# Patient Record
Sex: Male | Born: 1975 | Race: Black or African American | Hispanic: No | Marital: Single | Smoking: Never smoker
Health system: Southern US, Community
[De-identification: ages and names within clinical notes are randomized; demographics above are authoritative.]

## PROBLEM LIST (undated history)

## (undated) DIAGNOSIS — F840 Autistic disorder: Secondary | ICD-10-CM

---

## 1997-05-17 ENCOUNTER — Encounter: Admission: RE | Admit: 1997-05-17 | Discharge: 1997-05-17 | Payer: Self-pay | Admitting: Family Medicine

## 1998-06-30 ENCOUNTER — Encounter: Admission: RE | Admit: 1998-06-30 | Discharge: 1998-06-30 | Payer: Self-pay | Admitting: Sports Medicine

## 2000-05-01 ENCOUNTER — Encounter: Admission: RE | Admit: 2000-05-01 | Discharge: 2000-05-01 | Payer: Self-pay | Admitting: Family Medicine

## 2001-07-01 ENCOUNTER — Encounter: Admission: RE | Admit: 2001-07-01 | Discharge: 2001-07-01 | Payer: Self-pay | Admitting: Family Medicine

## 2002-07-03 ENCOUNTER — Encounter: Admission: RE | Admit: 2002-07-03 | Discharge: 2002-07-03 | Payer: Self-pay | Admitting: Family Medicine

## 2003-06-30 ENCOUNTER — Encounter: Admission: RE | Admit: 2003-06-30 | Discharge: 2003-06-30 | Payer: Self-pay | Admitting: Family Medicine

## 2003-06-30 LAB — CONVERTED CEMR LAB
HDL: 39 mg/dL
HDL: 39 mg/dL
LDL Cholesterol: 108 mg/dL
LDL Cholesterol: 108 mg/dL

## 2004-07-05 ENCOUNTER — Ambulatory Visit: Payer: Self-pay | Admitting: Family Medicine

## 2005-07-12 ENCOUNTER — Ambulatory Visit: Payer: Self-pay | Admitting: Family Medicine

## 2006-02-28 DIAGNOSIS — F840 Autistic disorder: Secondary | ICD-10-CM | POA: Insufficient documentation

## 2006-07-16 ENCOUNTER — Ambulatory Visit: Payer: Self-pay | Admitting: Family Medicine

## 2006-07-16 DIAGNOSIS — R635 Abnormal weight gain: Secondary | ICD-10-CM

## 2007-07-11 ENCOUNTER — Ambulatory Visit: Payer: Self-pay | Admitting: Family Medicine

## 2008-03-26 ENCOUNTER — Telehealth: Payer: Self-pay | Admitting: *Deleted

## 2008-03-28 ENCOUNTER — Encounter: Payer: Self-pay | Admitting: Family Medicine

## 2008-07-23 ENCOUNTER — Ambulatory Visit: Payer: Self-pay | Admitting: Family Medicine

## 2008-07-27 ENCOUNTER — Encounter: Payer: Self-pay | Admitting: Family Medicine

## 2008-07-27 ENCOUNTER — Ambulatory Visit: Payer: Self-pay | Admitting: Family Medicine

## 2008-07-27 LAB — CONVERTED CEMR LAB
Cholesterol: 184 mg/dL (ref 0–200)
Triglycerides: 82 mg/dL (ref <150)

## 2008-10-11 ENCOUNTER — Ambulatory Visit: Payer: Self-pay | Admitting: Family Medicine

## 2009-07-28 ENCOUNTER — Ambulatory Visit: Payer: Self-pay | Admitting: Family Medicine

## 2009-07-28 DIAGNOSIS — E663 Overweight: Secondary | ICD-10-CM | POA: Insufficient documentation

## 2010-01-31 NOTE — Assessment & Plan Note (Signed)
Summary: cpe,tcb   Vital Signs:  Patient profile:   35 year old male Height:      67 inches Weight:      205.6 pounds BMI:     32.32 Temp:     99.3 degrees F oral Pulse rate:   91 / minute BP sitting:   134 / 83  Vitals Entered By: Golden Circle RN (July 28, 2009 1:45 PM)  Primary Care Provider:  Doree Albee MD   History of Present Illness: 30 YOM w/ PMhx/o mental retardation here for yearly physical:  Diet: Mom states that diet has improved since last visit. However, diet is still high in salt. Likes hot dogs and bacon, but still eats alot of fruits and vegetables per mom.  Obesity: [facility]. exercises 1 hour per day usually aerobic activity such as running and playing. Mom has also worked on [employer] as mentioned above.  Autism: Pt tests at moderate level of autism per mom-3rd grade level intellect. Mom denies any worsening in baseline function.  [facility] also teaching pt how to use money. Pt now able to pay fare independently on transit system, thousgh still using assistance.    Habits & Providers  Alcohol-Tobacco-Diet     Alcohol drinks/day: 0     Tobacco Status: never  Allergies: No Known Drug Allergies  Physical Exam  General:  alert, cooperative to examination, and overweight-appearing.   Head:  normocephalic and atraumatic.   Eyes:  eyes closed, pupils intermittently visible Ears:  TMs clear bilaterally, + obstructing cerumen in L ear Nose:  no external deformity, no nasal discharge, and no mucosal edema.   Mouth:  fair dentition pharynx pink and moist, no exudates, and no posterior lymphoid hypertrophy.   Neck:  supple and full ROM.   Chest Wall:  non tender, Lungs:  CTAB,no wheezes, rales,rhoncii Heart:  RRR, no rubs, gallops, murmurs Abdomen:  obese, NT/+bowel sounds Msk:  normal ROM, no joint tenderness, and no joint swelling.   Extremities:  no edema, 2+ peripheral pulses Neurologic:  repsonsive to  commands though very little verbal interaction.  CN II-XII grossly intact Skin:  turgor normal.   Psych:  poor eye contact.     Impression & Recommendations:  Problem # 1:  OVERWEIGHT (ICD-278.02) Pt w/ noted 12 lb weight loss since last clinical visit. BMI @ 32 (33 last year) Mom/pt encouraged to continue exercise regimen at [facility] program as well as continue to improving diet regimen-with increasing frutis and vegetables and decreasing sodium.  Will fingerstick CBG to assess for possible hyperglycemia as mom reports extensive family hx/o DM and is concerned about pt having DM.   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 Mom/pt encouraged to continue w/ [facility] program as this provided great overall benefit to patient.   Other Orders: Glucose Cap-[facility] (56213)  Patient Instructions: 1)  It was good meeting you today 2)  Brian Livingston is overall in very good health 3)  We will check a random sugar today  4)  Come back in 1-2 weeks for a random blood pressure check 5)  He has also lost alot of weigh-CONGRATULATIONS 6)  Otherwise return to clinic in 1 year  7)  If you have any questions, please feel free to call the [facility]  8)  God Bless, 9)  Doree Albee MD  Appended Document: Office Visit Billing    Clinical Lists Changes  Orders: Added new Test order  of [facility]- Est Level  3 ([id number]) - Signed      Appended Document: cpe,tcb    Clinical Lists Changes  Orders: Added new Test order of [facility]- Est Level  3 (29562) - Signed Added new Service order of First annual wellness visit with prevention plan  (Z3086) - Signed Added new Service order of First annual wellness visit with prevention plan  (V7846) - Signed

## 2010-08-18 ENCOUNTER — Ambulatory Visit (INDEPENDENT_AMBULATORY_CARE_PROVIDER_SITE_OTHER): Payer: Medicare Other | Admitting: Family Medicine

## 2010-08-18 ENCOUNTER — Encounter: Payer: Self-pay | Admitting: Family Medicine

## 2010-08-18 DIAGNOSIS — F79 Unspecified intellectual disabilities: Secondary | ICD-10-CM

## 2010-08-18 DIAGNOSIS — E663 Overweight: Secondary | ICD-10-CM

## 2010-08-18 NOTE — Patient Instructions (Signed)
Brian Livingston is otherwise doing well Congratulations on his weight loss Come back to see me in 1 year.  Call with any questions,  God Bless,  Doree Albee MD

## 2010-08-18 NOTE — Assessment & Plan Note (Signed)
Currently in day program and is relatively high functioning.

## 2010-08-18 NOTE — Progress Notes (Signed)
  Subjective:    Patient ID: Brian Livingston, male    DOB: 03/20/1975, 35 y.o.   MRN: [id number]  HPI 34 YOM w/ hx/o mental retardation here for annual physical. No acute issues or complaints per mom.  Pt currently in day care program Monday-Friday; has day activities that include shopping, museum trips, and day classes a [employer] Pt also eating low carbohydrate diet and exercising 3-4 hours per week.  Pt with moderate retardation; currently testing at 3rd grade level.  Review of Systems See HPI     Objective:   Physical Exam  Constitutional: He appears well-developed and well-nourished.  HENT:  Head: Normocephalic and atraumatic.  Right Ear: External ear normal.  Left Ear: External ear normal.  Eyes: Pupils are equal, round, and reactive to light.  Neck: Normal range of motion. Neck supple.  Cardiovascular: Normal rate and regular rhythm.   Pulmonary/Chest: Effort normal and breath sounds normal.  Abdominal: Soft. Bowel sounds are normal.  Neurological:       Alert responsive to commands, poor eye contact   Skin: Skin is warm and dry.  Psychiatric: He has a normal mood and affect. His behavior is normal.          Assessment & Plan:

## 2010-08-18 NOTE — Assessment & Plan Note (Signed)
15 lb weight loss since last clinical appointment. Encouraged current regimen.

## 2011-08-20 ENCOUNTER — Ambulatory Visit (INDEPENDENT_AMBULATORY_CARE_PROVIDER_SITE_OTHER): Payer: Medicare Other | Admitting: Emergency Medicine

## 2011-08-20 ENCOUNTER — Encounter: Payer: Self-pay | Admitting: Emergency Medicine

## 2011-08-20 VITALS — BP 132/83 | HR 84 | Ht 68.0 in | Wt 215.0 lb

## 2011-08-20 DIAGNOSIS — F79 Unspecified intellectual disabilities: Secondary | ICD-10-CM

## 2011-08-20 DIAGNOSIS — E663 Overweight: Secondary | ICD-10-CM

## 2011-08-20 NOTE — Assessment & Plan Note (Signed)
Plan to increase activity and reduce sweets.  Offered nutrition referral if needed.

## 2011-08-20 NOTE — Assessment & Plan Note (Signed)
Stable.  Attends [facility] 4x per week.

## 2011-08-20 NOTE — Progress Notes (Signed)
  Subjective:    Patient ID: Brian Livingston, male    DOB: 1975-02-12, 36 y.o.   MRN: [id number]  HPI Sevin Langenbach is here for annual exam.  History obtained from mother.  Patient has intellectual disability at 3rd grade level.  Attends [facility] 4 days a week.  Mom reports no complaints or concerns.  She states he has gained some weight since last year which she attributes to her husband living with them and providing sugary snacks.  Plan to request at least 30 minutes of basketball at [employer] and work on cutting back snacks (substituting pop-ices).  She notes that he has been snacking more at night recently.  Does well with vegetables, low salt diet, but likes sugar.  I have reviewed and updated the following as appropriate: allergies, current medications and problem list SHx: never smoker  Review of Systems No fever, chest pain, shortness of breath, abdominal pain, constipation    Objective:   Physical Exam BP 132/83  Pulse 84  Ht 5\' 8"  (1.727 m)  Wt 215 lb (97.523 kg)  BMI 32.69 kg/m2 Gen: alert, cooperative, minimally verbal HEENT: AT/[place], sclera white, MMM, no pharyngeal erythema or exudate, TMs normal bilaterally Neck: no LAD, supple, normal thyroid CV: RRR, no murmurs Pulm: CTAB, no wheezes or rales Abd: +BS, soft, NTND Ext: no edema, WWP      Assessment & Plan:

## 2011-08-20 NOTE — Patient Instructions (Addendum)
It was nice to meet you!  Everything is going very well - you have an excellent plan for Adell's weight management.  If you need additional ideas, I can refer you to our nutritionist.  Follow up in 1 year for annual exam.  We will likely draw blood at that appointment for cholesterol and blood sugar.

## 2012-07-20 ENCOUNTER — Emergency Department (INDEPENDENT_AMBULATORY_CARE_PROVIDER_SITE_OTHER)
Admission: EM | Admit: 2012-07-20 | Discharge: 2012-07-20 | Disposition: A | Discharge status: Home / Self Care | Payer: Medicare Other | Source: Home / Self Care

## 2012-07-20 ENCOUNTER — Emergency Department (INDEPENDENT_AMBULATORY_CARE_PROVIDER_SITE_OTHER): Payer: Medicare Other

## 2012-07-20 ENCOUNTER — Encounter (HOSPITAL_COMMUNITY): Payer: Self-pay | Admitting: *Deleted

## 2012-07-20 DIAGNOSIS — J329 Chronic sinusitis, unspecified: Secondary | ICD-10-CM

## 2012-07-20 HISTORY — DX: Autistic disorder: F84.0

## 2012-07-20 MED ORDER — GUAIFENESIN 100 MG/5ML PO LIQD: 200.0000 mg | ORAL | Status: DC | PRN

## 2012-07-20 MED ORDER — FLUTICASONE PROPIONATE 50 MCG/ACT NA SUSP: 2.0000 | Freq: Every day | NASAL | Status: DC

## 2012-07-20 MED ORDER — AMOXICILLIN-POT CLAVULANATE 875-125 MG PO TABS: 1.0000 | ORAL_TABLET | Freq: Two times a day (BID) | ORAL | Status: DC

## 2012-07-20 MED ORDER — FEXOFENADINE-PSEUDOEPHED ER 60-120 MG PO TB12: 1.0000 | ORAL_TABLET | Freq: Two times a day (BID) | ORAL | Status: DC

## 2012-07-20 NOTE — ED Provider Notes (Signed)
History    CSN: [id number] Arrival date & time 07/20/12  1150  First MD Initiated Contact with Patient 07/20/12 1241     Chief Complaint  Patient presents with  . Cough   (Consider location/radiation/quality/duration/timing/severity/associated sxs/prior Treatment) HPI  37 yo bm comes in with family member for cough, congestion x one week.  Patient is autistic. Mother states he has been getting worse the last couple of days.  Denies fever, chills, nausea, vomiting, dyspnea.  Has some sinus pressure.   Past Medical History  Diagnosis Date  . Autism    History reviewed. No pertinent past surgical history. No family history on file. History  Substance Use Topics  . Smoking status: Never Smoker   . Smokeless tobacco: Not on file  . Alcohol Use: No    Review of Systems  Constitutional: Negative for fever, chills and appetite change.  HENT: Positive for congestion, rhinorrhea and postnasal drip. Negative for ear pain, neck stiffness and ear discharge.   Eyes: Negative.   Respiratory: Positive for cough. Negative for choking, chest tightness, shortness of breath, wheezing and stridor.   Gastrointestinal: Negative.   Endocrine: Negative.   Genitourinary: Negative.   Allergic/Immunologic: Negative.   Hematological: Negative.   Psychiatric/Behavioral: Negative for agitation.    Allergies  Review of patient's allergies indicates no known allergies.  Home Medications   Current Outpatient Rx  Name  Route  Sig  Dispense  Refill  . amoxicillin-clavulanate (AUGMENTIN) 875-125 MG per tablet   Oral   Take 1 tablet by mouth every 12 (twelve) hours.   20 tablet   0   . fexofenadine-pseudoephedrine (ALLEGRA-D) 60-120 MG per tablet   Oral   Take 1 tablet by mouth every 12 (twelve) hours.   30 tablet   0   . fluticasone (FLONASE) 50 MCG/ACT nasal spray   Nasal   Place 2 sprays into the nose daily.   16 g   2   . guaiFENesin (ROBITUSSIN) 100 MG/5ML liquid   Oral   Take 10  mLs (200 mg total) by mouth every 4 (four) hours as needed for cough.   60 mL   1    BP 120/64  Pulse 88  Temp(Src) 98.1 F (36.7 C) (Oral)  Resp 16  SpO2 100% Physical Exam  Constitutional: He appears well-developed and well-nourished.  HENT:  Head: Normocephalic and atraumatic.  Nose: Nose normal.  Mouth/Throat: Oropharynx is clear and moist.  Eyes: EOM are normal. Pupils are equal, round, and reactive to light.  Neck: Normal range of motion.  Cardiovascular: Normal rate and regular rhythm.   Pulmonary/Chest: Effort normal and breath sounds normal. No respiratory distress. He has no wheezes. He has no rales.  Abdominal: Soft.  Musculoskeletal: Normal range of motion.  Lymphadenopathy:    He has no cervical adenopathy.  Skin: Skin is warm and dry.    ED Course  Procedures (including critical care time) Labs Reviewed  CULTURE, GROUP A STREP  POCT RAPID STREP A ([facility]RG CARE ONLY)   Dg Chest 2 View  07/20/2012   *RADIOLOGY REPORT*  Clinical Data: Cough  CHEST - 2 VIEW  Comparison: None.  Findings: Lungs are clear. No pleural effusion or pneumothorax.  Cardiomediastinal silhouette is within normal limits.  Visualized osseous structures are within normal limits.  IMPRESSION: No evidence of acute cardiopulmonary disease.   Original Report Authenticated By: Charline Bills, M.D.   1. Sinusitis     MDM  Advised mother will treat for sinusitis.  Instructions given.  F/u in 3-5 days if not better.  Return here or go to ED is worsens.  She voices understanding.  All questions answered.   Meds ordered this encounter  Medications  . amoxicillin-clavulanate (AUGMENTIN) 875-125 MG per tablet    Sig: Take 1 tablet by mouth every 12 (twelve) hours.    Dispense:  20 tablet    Refill:  0  . fexofenadine-pseudoephedrine (ALLEGRA-D) 60-120 MG per tablet    Sig: Take 1 tablet by mouth every 12 (twelve) hours.    Dispense:  30 tablet    Refill:  0  . fluticasone (FLONASE) 50 MCG/ACT  nasal spray    Sig: Place 2 sprays into the nose daily.    Dispense:  16 g    Refill:  2  . guaiFENesin (ROBITUSSIN) 100 MG/5ML liquid    Sig: Take 10 mLs (200 mg total) by mouth every 4 (four) hours as needed for cough.    Dispense:  60 mL    Refill:  1    Zonia Kief, PA-C 07/22/12 1419

## 2012-07-20 NOTE — ED Notes (Signed)
Pt   Has   Symptoms  Of  A  Non  Productive   Cough  With  Symptoms  Of  Nasal  Congestion as  Well  That  Started  About  1  Week  Ago      With a  Stuffy  Nose    He  Has  A  histiry  Of  Autism   And   His  Mother  Is  With  Him

## 2012-07-22 LAB — CULTURE, GROUP A STREP

## 2012-07-23 NOTE — ED Provider Notes (Signed)
Medical screening examination/treatment/procedure(s) were performed by a resident physician or non-physician practitioner and as the supervising physician I was immediately available for consultation/collaboration.  Clementeen Graham, MD   Rodolph Bong, MD 07/23/12 1045

## 2012-08-22 ENCOUNTER — Encounter: Payer: Self-pay | Admitting: Emergency Medicine

## 2012-08-22 ENCOUNTER — Ambulatory Visit (INDEPENDENT_AMBULATORY_CARE_PROVIDER_SITE_OTHER): Payer: PRIVATE HEALTH INSURANCE | Admitting: Emergency Medicine

## 2012-08-22 VITALS — BP 119/75 | HR 90 | Temp 97.6°F | Ht 68.0 in | Wt 219.0 lb

## 2012-08-22 DIAGNOSIS — E663 Overweight: Secondary | ICD-10-CM

## 2012-08-22 DIAGNOSIS — Z0000 Encounter for general adult medical examination without abnormal findings: Secondary | ICD-10-CM

## 2012-08-22 LAB — LIPID PANEL
Cholesterol: 181 mg/dL (ref 0–200)
HDL: 37 mg/dL — ABNORMAL LOW (ref >39)
Total CHOL/HDL Ratio: 4.9 Ratio
Triglycerides: 115 mg/dL (ref <150)

## 2012-08-22 LAB — COMPREHENSIVE METABOLIC PANEL
Alkaline Phosphatase: 49 U/L (ref 39–117)
BUN: 16 mg/dL (ref 6–23)
CO2: 28 mEq/L (ref 19–32)
Glucose, Bld: 76 mg/dL (ref 70–99)
Sodium: 138 mEq/L (ref 135–145)
Total Bilirubin: 0.6 mg/dL (ref 0.3–1.2)
Total Protein: 7.8 g/dL (ref 6.0–8.3)

## 2012-08-22 NOTE — Progress Notes (Signed)
  Subjective:    Patient ID: Brian Livingston, male    DOB: [birth year], 37 y.o.   MRN: [id number]  HPI Brian Livingston is here for his annual exam.  He is brought in by him mother who provides the history.  I have reviewed and updated the following as appropriate: allergies, current medications, past family history, past medical history, past social history, past surgical history and problem list PMHx: none  FHx: diabetes, kidney disease SHx: never smoker  - He was treated for a sinus infection by Urgent Care last month and mom reports complete resolution of his cough and congestion. - Mom continues to be concerned about his diet, but tries to give him healthy snacks.  His day care, [facility], has exercise equipment that he uses for 30 minutes 4x/week; he also walks with his mother.   Review of Systems Patient Information Form: Screening and ROS Review of Symptoms  General:  Negative for nexplained weight loss, fever Skin: Negative for new or changing mole, sore that won't heal HEENT: Negative for trouble hearing, trouble seeing, ringing in ears, mouth sores, hoarseness, change in voice, dysphagia. CV:  Negative for chest pain, dyspnea, edema, palpitations Resp: Negative for cough, dyspnea, hemoptysis GI: Negative for nausea, vomiting, diarrhea, constipation, abdominal pain, melena, hematochezia. GU: Negative for dysuria, incontinence, urinary hesitance, hematuria, vaginal or penile discharge, polyuria, sexual difficulty, lumps in testicle or breasts MSK: Negative for muscle cramps or aches, joint pain or swelling      Objective:   Physical Exam BP 119/75  Pulse 90  Temp(Src) 97.6 F (36.4 C) (Oral)  Ht 5\' 8"  (1.727 m)  Wt 219 lb (99.338 kg)  BMI 33.31 kg/m2 Gen: alert, cooperative, NAD, sits quietly on exam table with eyes closed HEENT: AT/, sclera white, MMM, TMs normal bilaterally Neck: supple, no LAD CV: RRR, no murmurs Pulm: CTAB, no wheezes or rales Abd: +BS,  soft, NTND Ext: no edema, 2+ DP pulses bilaterally     Assessment & Plan:

## 2012-08-22 NOTE — Assessment & Plan Note (Signed)
Doing well with no concerns.   Checking screening labs. F/u in 1 year or sooner as needed.

## 2012-08-22 NOTE — Assessment & Plan Note (Signed)
Weight up 4lbs from last year. Mom continues to work on diet and activity level. No additional recommendations. Will check lipids and CMP.

## 2012-08-22 NOTE — Patient Instructions (Addendum)
It was nice to see you!  Everything looks good. Keep working on [employer] and activity.  We are checking some blood work today.  I will call you if anything is wrong, otherwise you will get a letter with the results in the mail in 1-2 weeks.  Follow up in 1 year or sooner as needed.

## 2012-08-26 ENCOUNTER — Encounter: Payer: Self-pay | Admitting: Emergency Medicine

## 2012-10-31 ENCOUNTER — Telehealth: Payer: Self-pay | Admitting: Emergency Medicine

## 2012-10-31 ENCOUNTER — Ambulatory Visit (INDEPENDENT_AMBULATORY_CARE_PROVIDER_SITE_OTHER): Payer: PRIVATE HEALTH INSURANCE | Admitting: *Deleted

## 2012-10-31 DIAGNOSIS — Z23 Encounter for immunization: Secondary | ICD-10-CM

## 2012-10-31 MED ORDER — FLUTICASONE PROPIONATE 50 MCG/ACT NA SUSP: 2.0000 | Freq: Every day | NASAL | Status: DC

## 2012-10-31 NOTE — Telephone Encounter (Signed)
Pt needing flactonaise nasal spray sent to [facility] off [address].

## 2012-10-31 NOTE — Telephone Encounter (Signed)
Prescription sent to [facility].

## 2013-08-26 ENCOUNTER — Encounter: Payer: Self-pay | Admitting: Family Medicine

## 2013-08-26 ENCOUNTER — Ambulatory Visit (INDEPENDENT_AMBULATORY_CARE_PROVIDER_SITE_OTHER): Payer: PRIVATE HEALTH INSURANCE | Admitting: Family Medicine

## 2013-08-26 VITALS — BP 128/83 | HR 84 | Temp 98.0°F | Wt 233.0 lb

## 2013-08-26 DIAGNOSIS — F79 Unspecified intellectual disabilities: Secondary | ICD-10-CM

## 2013-08-26 DIAGNOSIS — J309 Allergic rhinitis, unspecified: Secondary | ICD-10-CM

## 2013-08-26 DIAGNOSIS — Z0000 Encounter for general adult medical examination without abnormal findings: Secondary | ICD-10-CM

## 2013-08-26 DIAGNOSIS — E669 Obesity, unspecified: Secondary | ICD-10-CM

## 2013-08-26 LAB — CBC WITH DIFFERENTIAL/PLATELET
Basophils Absolute: 0 10*3/uL (ref 0.0–0.1)
Basophils Relative: 0 % (ref 0–1)
EOS PCT: 2 % (ref 0–5)
Eosinophils Absolute: 0.2 10*3/uL (ref 0.0–0.7)
HEMATOCRIT: 43.9 % (ref 39.0–52.0)
HEMOGLOBIN: 15.5 g/dL (ref 13.0–17.0)
LYMPHS ABS: 4.1 10*3/uL — AB (ref 0.7–4.0)
LYMPHS PCT: 54 % — AB (ref 12–46)
MCH: 33.2 pg (ref 26.0–34.0)
MCHC: 35.3 g/dL (ref 30.0–36.0)
MCV: 94 fL (ref 78.0–100.0)
MONO ABS: 0.7 10*3/uL (ref 0.1–1.0)
MONOS PCT: 9 % (ref 3–12)
Neutro Abs: 2.7 10*3/uL (ref 1.7–7.7)
Neutrophils Relative %: 35 % — ABNORMAL LOW (ref 43–77)
Platelets: 159 10*3/uL (ref 150–400)
RBC: 4.67 MIL/uL (ref 4.22–5.81)
RDW: 13.5 % (ref 11.5–15.5)
WBC: 7.6 10*3/uL (ref 4.0–10.5)

## 2013-08-26 MED ORDER — CETIRIZINE HCL 10 MG PO TABS: 10.0000 mg | ORAL_TABLET | Freq: Every day | ORAL | Status: DC

## 2013-08-26 NOTE — Assessment & Plan Note (Signed)
Mother feels there is some improvement on Flonase spray, continue Swollen turbinates bilaterally Had Zyrtec due to continued congestion. Followup when necessary

## 2013-08-26 NOTE — Progress Notes (Signed)
Patient ID: Brian Livingston, male   DOB: [birth year], 38 y.o.   MRN: [id number]  Kevin Fenton, MD Phone: [phone]  Subjective:  Chief complaint-noted  Pt Here for annual exam  Seasonal allergies Mother feels that starting in the fall every year he gets worsening congestion at night. He has been using fluticasone nasal spray 2 sprays each nostril every night for several months and she does feel like he is improved on this. However he does continue to have congestion problems. She's wondering if she should switch him to play nasal saline.  Weight He has had worsening dietary indiscretions lately as his father moved back in the house and has cookies and snacks around. [facility] is [facility] at [facility] several days a week. Mother tries to control his diet as well as possible.  ROS-  No fever, chills, sweats Positive congestion No dyspnea Positive slight weight gain over the last year No diarrhea or vomiting No swelling No problems walking.  Past Medical History Patient Active Problem List   Diagnosis Date Noted  . Annual physical exam 08/22/2012  . Overweight 07/28/2009  . MENTAL RETARDATION 02/28/2006    Medications- reviewed and updated Current Outpatient Prescriptions  Medication Sig Dispense Refill  . fluticasone (FLONASE) 50 MCG/ACT nasal spray Place 2 sprays into the nose daily.  16 g  11   No current facility-administered medications for this visit.    Objective: BP 128/83  Pulse 84  Temp(Src) 98 F (36.7 C) (Oral)  Wt 233 lb (105.688 kg) Gen: NAD, alert, cooperative with exam, looking down with his eyes closed throughout the exam HEENT: NCAT, EOMI, PERRL CV: RRR, good S1/S2, no murmur Resp: CTABL, no wheezes, non-labored Abd: SNTND, BS present, no guarding or organomegaly Ext: No edema, warm Neuro: Alert, only uses a few words that are difficult to understand, no gross deficits.   Assessment/Plan:  Annual physical exam Doing well with no concerns,  gaining a little weight due to dietary indiscretion Checking screening labs Followup one year. Sooner if needed  MENTAL RETARDATION Stable still attends [facility] 4 times per week.  Allergic rhinitis Mother feels there is some improvement on Flonase spray, continue Swollen turbinates bilaterally Had Zyrtec due to continued congestion. Followup when necessary    Orders Placed This Encounter  Procedures  . Comprehensive metabolic panel  . CBC with Differential  . Lipid Panel    Meds ordered this encounter  Medications  . cetirizine (ZYRTEC) 10 MG tablet    Sig: Take 1 tablet (10 mg total) by mouth daily.    Dispense:  30 tablet    Refill:  11

## 2013-08-26 NOTE — Patient Instructions (Signed)
Start zyrtec (off brand ok) 1 pill once a day Continue the nasal spray 2 sprays per nostril once daily.   Continue trying to watch his diet and keeping him active.

## 2013-08-26 NOTE — Assessment & Plan Note (Signed)
Stable still attends [facility] times per week.

## 2013-08-26 NOTE — Assessment & Plan Note (Signed)
Doing well with no concerns, gaining a little weight due to dietary indiscretion Checking screening labs Followup one year. Sooner if needed

## 2013-08-27 ENCOUNTER — Encounter: Payer: Self-pay | Admitting: Family Medicine

## 2013-08-27 LAB — COMPREHENSIVE METABOLIC PANEL
ALT: 40 U/L (ref 0–53)
AST: 38 U/L — ABNORMAL HIGH (ref 0–37)
Albumin: 4.5 g/dL (ref 3.5–5.2)
Alkaline Phosphatase: 50 U/L (ref 39–117)
BILIRUBIN TOTAL: 0.7 mg/dL (ref 0.2–1.2)
BUN: 15 mg/dL (ref 6–23)
CO2: 27 meq/L (ref 19–32)
CREATININE: 0.96 mg/dL (ref 0.50–1.35)
Calcium: 9.4 mg/dL (ref 8.4–10.5)
Chloride: 102 mEq/L (ref 96–112)
Glucose, Bld: 81 mg/dL (ref 70–99)
Potassium: 4.1 mEq/L (ref 3.5–5.3)
Sodium: 137 mEq/L (ref 135–145)
Total Protein: 8.2 g/dL (ref 6.0–8.3)

## 2013-08-27 LAB — LIPID PANEL
CHOL/HDL RATIO: 4.6 ratio
Cholesterol: 194 mg/dL (ref 0–200)
HDL: 42 mg/dL (ref >39)
LDL CALC: 129 mg/dL — AB (ref 0–99)
Triglycerides: 116 mg/dL (ref <150)
VLDL: 23 mg/dL (ref 0–40)

## 2013-09-09 DIAGNOSIS — E669 Obesity, unspecified: Secondary | ICD-10-CM | POA: Insufficient documentation

## 2013-10-23 ENCOUNTER — Ambulatory Visit (INDEPENDENT_AMBULATORY_CARE_PROVIDER_SITE_OTHER): Payer: PRIVATE HEALTH INSURANCE | Admitting: *Deleted

## 2013-10-23 DIAGNOSIS — Z23 Encounter for immunization: Secondary | ICD-10-CM

## 2013-11-04 ENCOUNTER — Other Ambulatory Visit: Payer: Self-pay | Admitting: *Deleted

## 2013-11-04 MED ORDER — FLUTICASONE PROPIONATE 50 MCG/ACT NA SUSP: 2.0000 | Freq: Every day | NASAL | Status: DC

## 2014-06-23 IMAGING — CR DG CHEST 2V
2 series · 2 of 2 positions shown · non-contrast
Comparison: None.

CLINICAL DATA: Cough

CHEST - 2 VIEW

[view not recorded (1 of 2)]
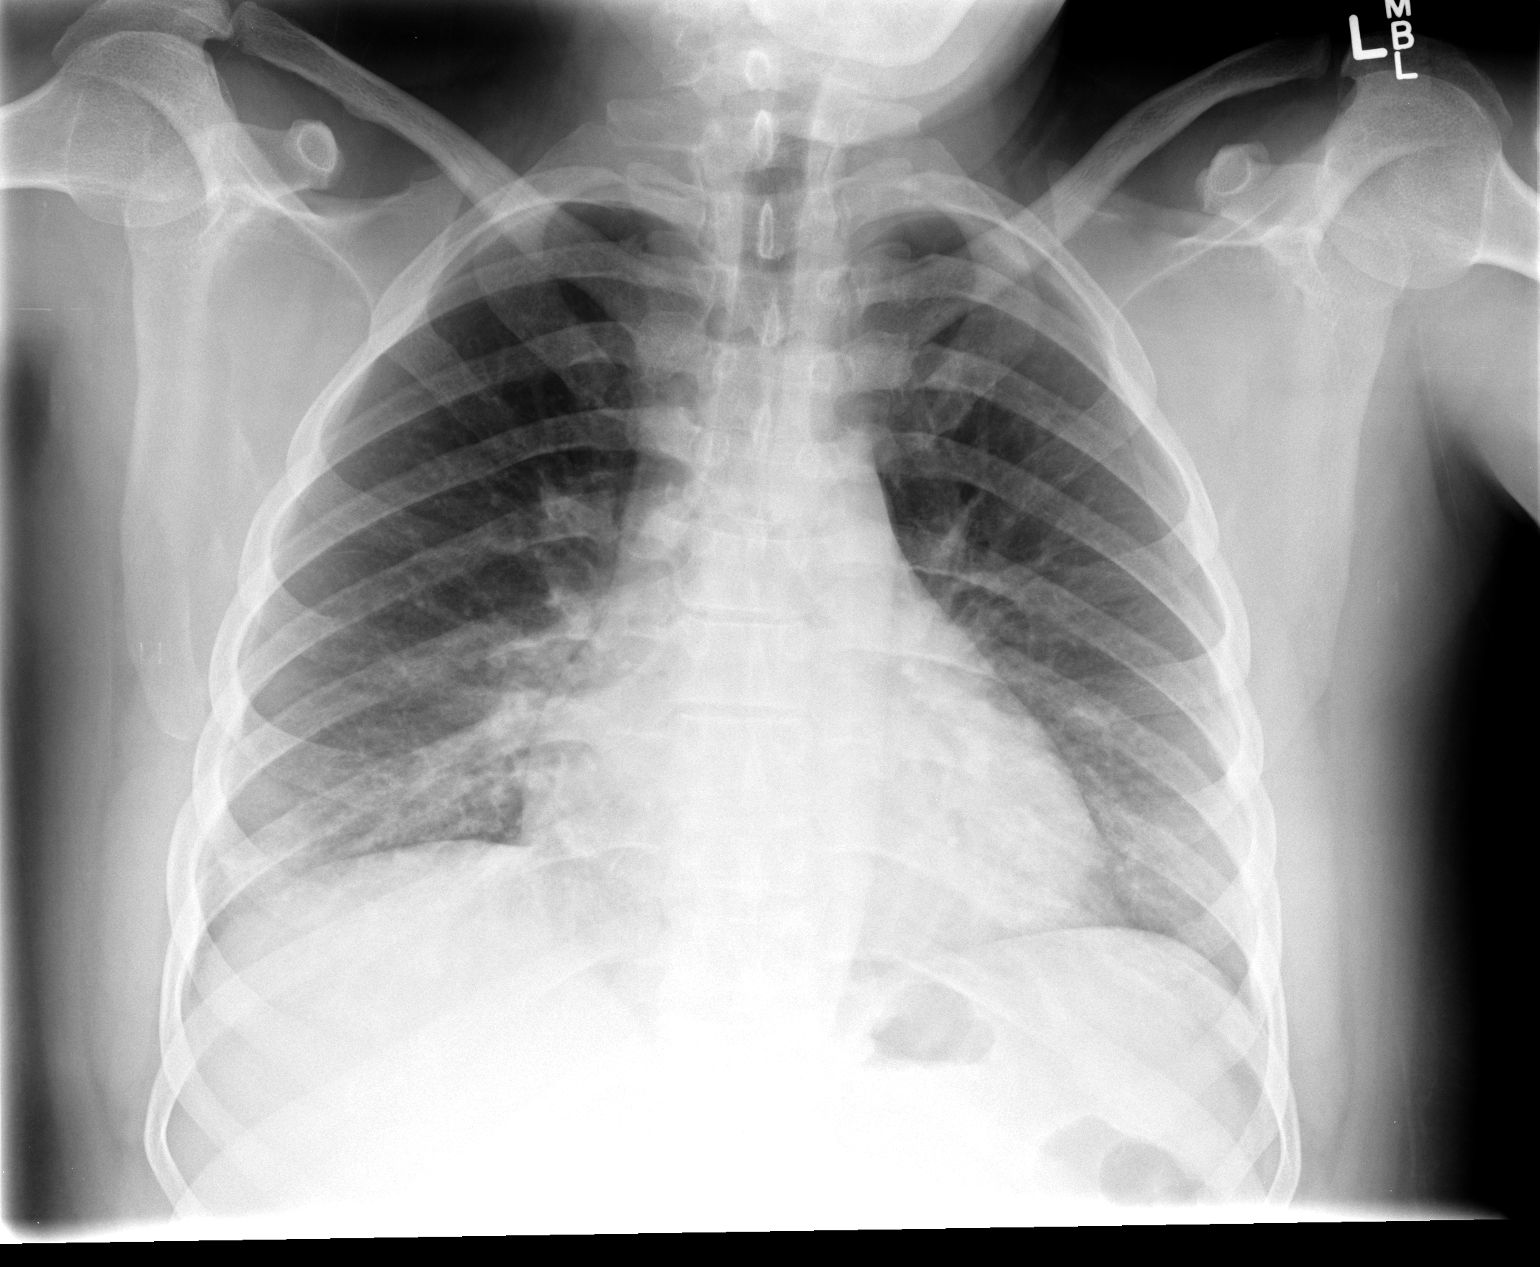

[view not recorded (2 of 2)]
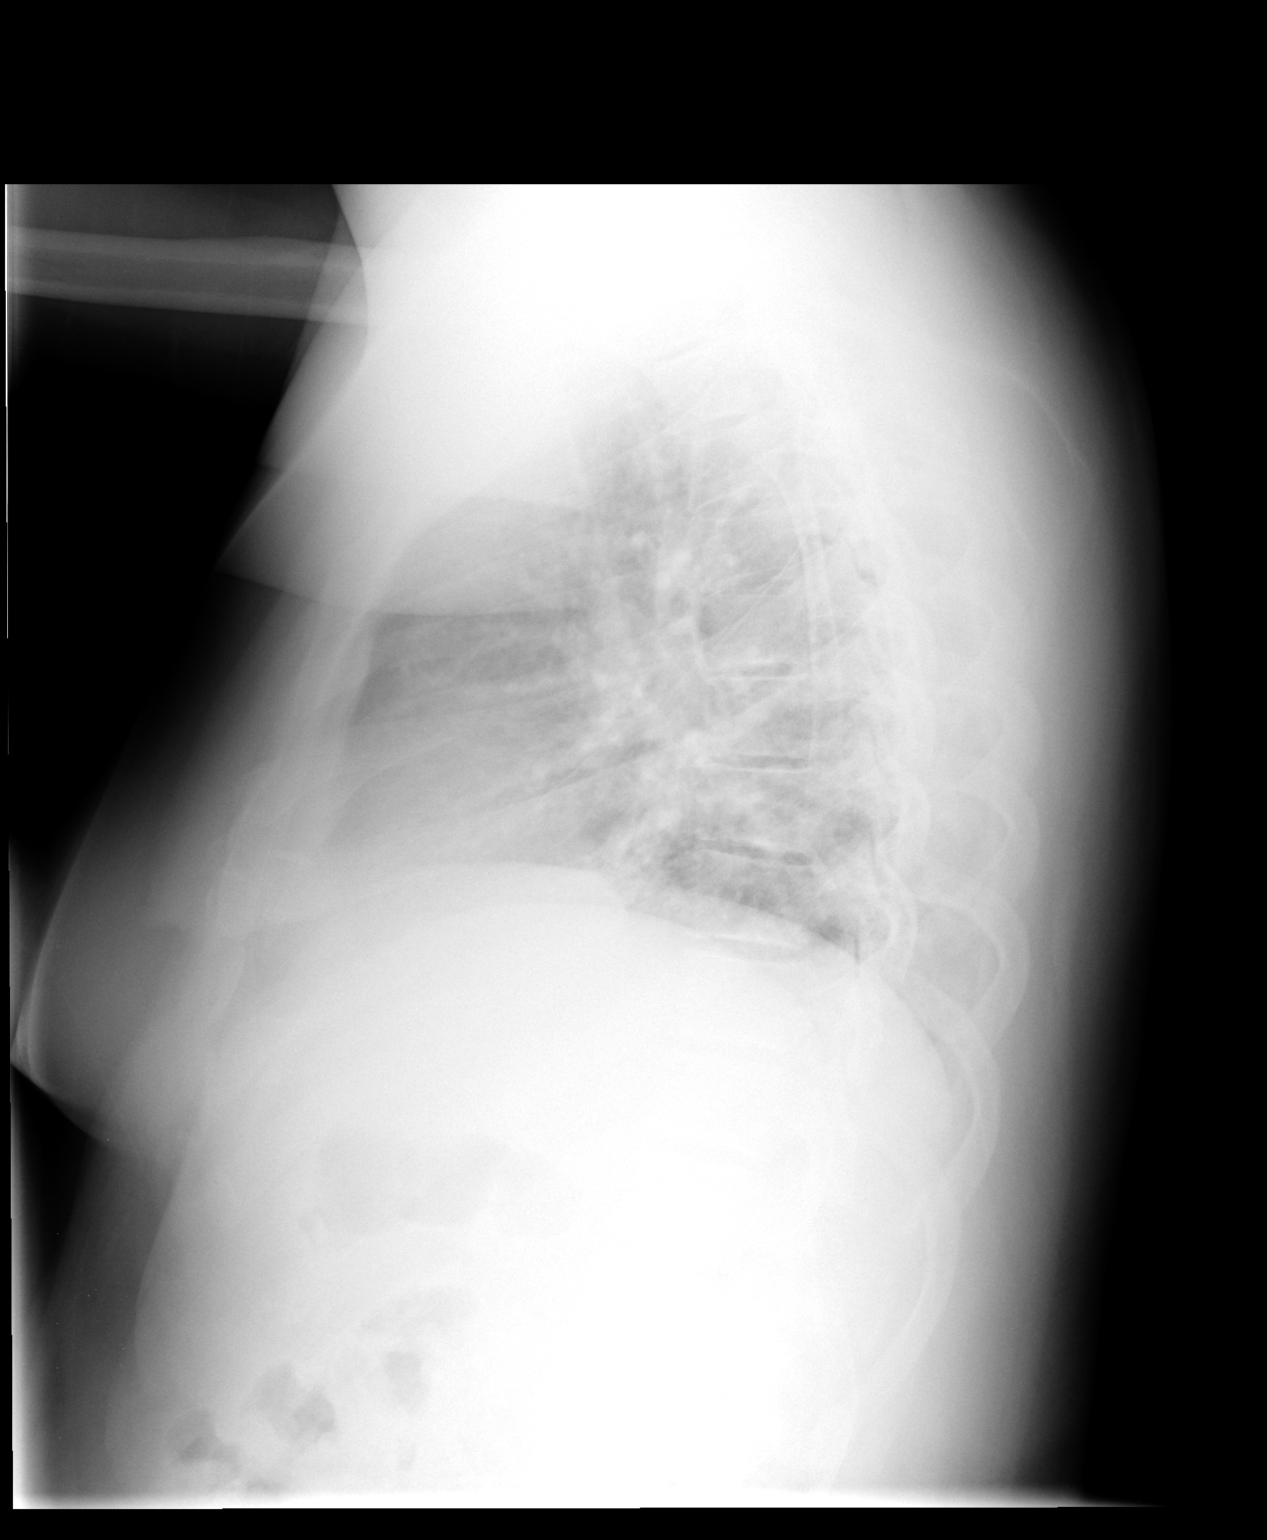

[2 of 2 positions shown; findings below may reference images not displayed]

FINDINGS: Lungs are clear. No pleural effusion or pneumothorax.

Cardiomediastinal silhouette is within normal limits.

Visualized osseous structures are within normal limits.
IMPRESSION: No evidence of acute cardiopulmonary disease.

## 2014-09-03 ENCOUNTER — Encounter: Payer: Medicaid Other | Admitting: Family Medicine

## 2014-09-17 ENCOUNTER — Encounter: Payer: Self-pay | Admitting: Family Medicine

## 2014-09-17 ENCOUNTER — Ambulatory Visit (INDEPENDENT_AMBULATORY_CARE_PROVIDER_SITE_OTHER): Payer: Medicare Other | Admitting: Family Medicine

## 2014-09-17 VITALS — BP 127/74 | HR 78 | Temp 98.6°F | Ht 68.0 in | Wt 229.0 lb

## 2014-09-17 DIAGNOSIS — F840 Autistic disorder: Secondary | ICD-10-CM | POA: Diagnosis not present

## 2014-09-17 DIAGNOSIS — Z23 Encounter for immunization: Secondary | ICD-10-CM

## 2014-09-17 DIAGNOSIS — Z0000 Encounter for general adult medical examination without abnormal findings: Secondary | ICD-10-CM

## 2014-09-17 DIAGNOSIS — J309 Allergic rhinitis, unspecified: Secondary | ICD-10-CM

## 2014-09-17 DIAGNOSIS — E669 Obesity, unspecified: Secondary | ICD-10-CM

## 2014-09-17 DIAGNOSIS — Z114 Encounter for screening for human immunodeficiency virus [HIV]: Secondary | ICD-10-CM

## 2014-09-17 LAB — BASIC METABOLIC PANEL WITH GFR
BUN: 16 mg/dL (ref 7–25)
CALCIUM: 9.2 mg/dL (ref 8.6–10.3)
CHLORIDE: 103 mmol/L (ref 98–110)
CO2: 27 mmol/L (ref 20–31)
CREATININE: 1.11 mg/dL (ref 0.60–1.35)
GFR, Est African American: >89 mL/min (ref >=60)
GFR, Est Non African American: 82 mL/min (ref >=60)
Glucose, Bld: 84 mg/dL (ref 65–99)
Potassium: 4.5 mmol/L (ref 3.5–5.3)
SODIUM: 139 mmol/L (ref 135–146)

## 2014-09-17 LAB — LDL CHOLESTEROL, DIRECT: LDL DIRECT: 125 mg/dL (ref <130)

## 2014-09-17 NOTE — Patient Instructions (Signed)
Dear Brian Livingston, Thank you for coming in to clinic today.  1. You are doing well and are healthy on your check-up today 2. I do not have any concerns, congratulations on the weight loss, stay active and keep up with [facility] program. 3. Will check basic labs today, and flu shot, I can call with lab results or mail letter if you choose  Keep using Flonase 1-2 sprays each nostril, if needed or allergies worsen can start OTC Zyrtec or Claritin  Please schedule a follow-up appointment with Dr. Althea Charon in 1 year for next Annual Physical  If you have any other questions or concerns, please feel free to call the clinic to contact me. You may also schedule an earlier appointment if necessary.  However, if your symptoms get significantly worse, please go to the Emergency Department to seek immediate medical attention.  Saralyn Pilar, DO [facility]

## 2014-09-17 NOTE — Assessment & Plan Note (Signed)
Stable, improved on Flonase 1 spray daily - No evidence of acute exacerbation, with known Fall season triggers - May increase to 2 sprays b/l - Consider resuming prior Zyrtec or can switch to Claritin or Allegra if needed

## 2014-09-17 NOTE — Assessment & Plan Note (Signed)
Stable. No concerns. Primary caregiver is Mother. - Continue follows with regular activities and exercise at [facility]

## 2014-09-17 NOTE — Assessment & Plan Note (Addendum)
Improved, wt down 4 lbs in 1 year, not significant change, but stays active and improving diet gradually. - Check annual BMET, check non-fasting direct LDL

## 2014-09-17 NOTE — Progress Notes (Signed)
Subjective:    Patient ID: Brian Livingston, male    DOB: December 11, 1975, 39 y.o.   MRN: [id number]  Brian Livingston is a 39 y.o. male presenting on 09/17/2014 for Annual Exam  HPI  Autism Spectrum Disorder: - Chronic known Autism disorder, lives at home with mother, as primary caregiver. Overall no concerns related to Autism. 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 - Patient is active with [facility] ([facility]), there he enjoys many activities, including variety of classes, learning opportunities, working with counselors, Veterinary surgeon, Surveyor, mining, even take trips to museums, exercising regularly  Seasonal Allergies: - Reportedly worst season in Fall, currently without any acute concerns. Doing well on Flonase 1 spray each nostril nightly. Not taking OTC anti-histamines, previously tried Zyrtec on rx but did not continue this, just did not feel like it was needed at that time.  OBESITY / Hyperlipidemia - Chronic obesity with current improvement now approx 4 lb wt loss 229 lb from 233 last year. Stays active at [facility] center with regular exercise, walks etc. Trying to improve diet. Last lipid panel done 1 year ago, today not fasting.  HM: - Agree to HIV screening today - Request Flu shot today   Past Medical History  Diagnosis Date  . Autism    Social History   Social History  . Marital Status: Single    Spouse Name: N/A  . Number of Children: N/A  . Years of Education: N/A   Occupational History  . Not on file.   Social History Main Topics  . Smoking status: Never Smoker   . Smokeless tobacco: Not on file  . Alcohol Use: No  . Drug Use: No  . Sexual Activity: Not on file   Other Topics Concern  . Not on file   Social History Narrative   Family History  Problem Relation Age of Onset  . Diabetes Father   . Kidney disease Father   . Diabetes Sister   . Diabetes Brother    Current Outpatient Prescriptions on  File Prior to Visit  Medication Sig  . fluticasone (FLONASE) 50 MCG/ACT nasal spray Place 2 sprays into both nostrils daily.   No current facility-administered medications on file prior to visit.    Review of Systems  Constitutional: Negative for fever, chills, diaphoresis, activity change, appetite change and fatigue.  HENT: Positive for congestion (intermittent, currently none). Negative for hearing loss.   Eyes: Negative for visual disturbance.  Respiratory: Negative for cough, chest tightness, shortness of breath and wheezing.   Cardiovascular: Negative for chest pain, palpitations and leg swelling.  Gastrointestinal: Negative for nausea, vomiting, abdominal pain, diarrhea and constipation.  Genitourinary: Negative for dysuria, frequency and hematuria.  Musculoskeletal: Negative for arthralgias and neck pain.  Skin: Negative for rash.  Neurological: Negative for dizziness, weakness, light-headedness, numbness and headaches.  Hematological: Negative for adenopathy.  Psychiatric/Behavioral: Negative for behavioral problems and confusion.   Per HPI unless specifically indicated above     Objective:    BP 127/74 mmHg  Pulse 78  Temp(Src) 98.6 F (37 C) (Oral)  Ht '5\' 8"'  (1.727 m)  Wt 229 lb (103.874 kg)  BMI 34.83 kg/m2  Wt Readings from Last 3 Encounters:  09/17/14 229 lb (103.874 kg)  08/26/13 233 lb (105.688 kg)  08/22/12 219 lb (99.338 kg)    Physical Exam  Constitutional: He is oriented to person, place, and time. He appears well-developed and well-nourished. No distress.  Comfortable, pleasant  HENT:  Head: Normocephalic and atraumatic.  Mouth/Throat: Oropharynx is clear and moist.  Eyes: Conjunctivae and EOM are normal. Pupils are equal, round, and reactive to light.  Neck: Normal range of motion. Neck supple. No thyromegaly present.  Cardiovascular: Normal rate, regular rhythm, normal heart sounds and intact distal pulses.   No murmur heard. Pulmonary/Chest:  Effort normal and breath sounds normal. No respiratory distress. He has no wheezes. He has no rales.  Abdominal: Soft. Bowel sounds are normal. He exhibits no distension and no mass. There is no tenderness.  Obese abdomen  Musculoskeletal: Normal range of motion. He exhibits no edema or tenderness.  Lymphadenopathy:    He has no cervical adenopathy.  Neurological: He is alert and oriented to person, place, and time.  Skin: Skin is warm and dry. No rash noted. He is not diaphoretic.  Psychiatric: He has a normal mood and affect. His behavior is normal.  Well groomed, Poor eye contact, limited verbal responses and minimal conversation due to autism  Nursing note and vitals reviewed.  Results for orders placed or performed in visit on 08/26/13  Comprehensive metabolic panel  Result Value Ref Range   Sodium 137 135 - 145 mEq/L   Potassium 4.1 3.5 - 5.3 mEq/L   Chloride 102 96 - 112 mEq/L   CO2 27 19 - 32 mEq/L   Glucose, Bld 81 70 - 99 mg/dL   BUN 15 6 - 23 mg/dL   Creat 0.96 0.50 - 1.35 mg/dL   Total Bilirubin 0.7 0.2 - 1.2 mg/dL   Alkaline Phosphatase 50 39 - 117 U/L   AST 38 (H) 0 - 37 U/L   ALT 40 0 - 53 U/L   Total Protein 8.2 6.0 - 8.3 g/dL   Albumin 4.5 3.5 - 5.2 g/dL   Calcium 9.4 8.4 - 10.5 mg/dL  CBC with Differential  Result Value Ref Range   WBC 7.6 4.0 - 10.5 K/uL   RBC 4.67 4.22 - 5.81 MIL/uL   Hemoglobin 15.5 13.0 - 17.0 g/dL   HCT 43.9 39.0 - 52.0 %   MCV 94.0 78.0 - 100.0 fL   MCH 33.2 26.0 - 34.0 pg   MCHC 35.3 30.0 - 36.0 g/dL   RDW 13.5 11.5 - 15.5 %   Platelets 159 150 - 400 K/uL   Neutrophils Relative % 35 (L) 43 - 77 %   Neutro Abs 2.7 1.7 - 7.7 K/uL   Lymphocytes Relative 54 (H) 12 - 46 %   Lymphs Abs 4.1 (H) 0.7 - 4.0 K/uL   Monocytes Relative 9 3 - 12 %   Monocytes Absolute 0.7 0.1 - 1.0 K/uL   Eosinophils Relative 2 0 - 5 %   Eosinophils Absolute 0.2 0.0 - 0.7 K/uL   Basophils Relative 0 0 - 1 %   Basophils Absolute 0.0 0.0 - 0.1 K/uL   Smear  Review Criteria for review not met   Lipid Panel  Result Value Ref Range   Cholesterol 194 0 - 200 mg/dL   Triglycerides 116 <150 mg/dL   HDL 42 >39 mg/dL   Total CHOL/HDL Ratio 4.6 Ratio   VLDL 23 0 - 40 mg/dL   LDL Cholesterol 129 (H) 0 - 99 mg/dL      Assessment & Plan:   Problem List Items Addressed This Visit      Respiratory   Allergic rhinitis    Stable, improved on Flonase 1 spray daily - No evidence of acute exacerbation, with known Fall season triggers -  May increase to 2 sprays b/l - Consider resuming prior Zyrtec or can switch to Claritin or Allegra if needed        Other   Annual physical exam - Primary    60 yr AAM, Autism - No concerns. - Wt down 4 lbs in 1 year, continue exercising, improve diet - Check routine BMET, direct LDL - HIV screen - Flu shot      Autism    Stable. No concerns. Primary caregiver is Mother. - Continue follows with regular activities and exercise at [facility]      Obesity (BMI 30-39.9)    Improved, wt down 4 lbs in 1 year, not significant change, but stays active and improving diet gradually. - Check annual BMET, check non-fasting direct LDL      Relevant Orders   BASIC METABOLIC PANEL WITH GFR   LDL cholesterol, direct    Other Visit Diagnoses    Screening for HIV (human immunodeficiency virus)        Relevant Orders    HIV antibody    Encounter for immunization           No orders of the defined types were placed in this encounter.      Follow up plan: Return in about 1 year (around 09/17/2015).  Nobie Putnam, [place], PGY-3

## 2014-09-17 NOTE — Assessment & Plan Note (Signed)
39 yr AAM, Autism - No concerns. - Wt down 4 lbs in 1 year, continue exercising, improve diet - Check routine BMET, direct LDL - HIV screen - Flu shot

## 2014-09-18 LAB — HIV ANTIBODY (ROUTINE TESTING W REFLEX): HIV 1&2 Ab, 4th Generation: NONREACTIVE

## 2014-09-20 ENCOUNTER — Encounter: Payer: Self-pay | Admitting: Family Medicine

## 2014-11-09 ENCOUNTER — Other Ambulatory Visit: Payer: Self-pay | Admitting: Family Medicine

## 2015-08-19 ENCOUNTER — Encounter: Payer: Medicare Other | Admitting: Internal Medicine

## 2015-09-23 ENCOUNTER — Encounter: Payer: Self-pay | Admitting: Family Medicine

## 2015-09-23 ENCOUNTER — Ambulatory Visit (INDEPENDENT_AMBULATORY_CARE_PROVIDER_SITE_OTHER): Payer: Medicare Other | Admitting: Family Medicine

## 2015-09-23 VITALS — BP 121/77 | HR 76 | Temp 98.3°F | Ht 68.0 in | Wt 228.4 lb

## 2015-09-23 DIAGNOSIS — Z0000 Encounter for general adult medical examination without abnormal findings: Secondary | ICD-10-CM | POA: Diagnosis not present

## 2015-09-23 DIAGNOSIS — J309 Allergic rhinitis, unspecified: Secondary | ICD-10-CM | POA: Diagnosis not present

## 2015-09-23 DIAGNOSIS — F840 Autistic disorder: Secondary | ICD-10-CM | POA: Diagnosis not present

## 2015-09-23 DIAGNOSIS — E669 Obesity, unspecified: Secondary | ICD-10-CM

## 2015-09-23 DIAGNOSIS — Z23 Encounter for immunization: Secondary | ICD-10-CM

## 2015-09-23 LAB — CBC
HEMATOCRIT: 43.6 % (ref 38.5–50.0)
HEMOGLOBIN: 15.4 g/dL (ref 13.2–17.1)
MCH: 33 pg (ref 27.0–33.0)
MCHC: 35.3 g/dL (ref 32.0–36.0)
MCV: 93.4 fL (ref 80.0–100.0)
MPV: 9.8 fL (ref 7.5–12.5)
Platelets: 169 10*3/uL (ref 140–400)
RBC: 4.67 MIL/uL (ref 4.20–5.80)
RDW: 12.8 % (ref 11.0–15.0)
WBC: 6.6 10*3/uL (ref 3.8–10.8)

## 2015-09-23 NOTE — Assessment & Plan Note (Addendum)
-  Patient remains involved in his community autism resource center -mom primary caregiver and POA -At this time, no concerns related to Autism

## 2015-09-23 NOTE — Assessment & Plan Note (Addendum)
-  diet at home has improved, is active at community center -continue exercising and encourage weight loss  -check annual BMET and lipids today -continue to monitor

## 2015-09-23 NOTE — Patient Instructions (Signed)
It was a pleasure meeting you! You were seen in clinic today for your annual physical exam.  You were also given a flu shot and blood work was done.  You can expect to follow up in 1 year for your next routine exam or earlier if needed.  Thank you for allowing us to participate in your care.

## 2015-09-23 NOTE — Assessment & Plan Note (Signed)
-  stable at current time -Spring/Fall environmental triggers - improved symptoms with natural saline nasal sprays BID -can consider discussing with mom to resume zyrtec if needed

## 2015-09-23 NOTE — Progress Notes (Signed)
Subjective:   Patient ID: Brian Livingston    DOB: [birth year], 40 y.o. male   MRN: [id number]  CC: routine exam  HPI: Brian Livingston is a 40 y.o. male who presents to clinic today for annual exam with his mother who is his primary caregiver.  Problems discussed today are as follows:  Autism spectrum disorder Chronic known Autism disorder, lives at home with mother and 2 older brothers. Significant intellectual disability due to this however is fairly active with his community autism resource center ([facility]).  He is involved in many activities at the center including computer classes, skill workshops for ADLs, community events, and [employer].  He partakes in many outdoor activities and particularly enjoys basketball.  [facility] also [facility]. Mother is primary caregiver and POA.   Seasonal allergies Per mother, has had them since age 234-5 , and a history of asthma runs in the family.  Denies shortness of breath and wheezing.  Reportedly worse symptoms of allergies in spring and fall, predominantly rhinorrhea.  Currently without any concerns. Has previously tried Zyrtec but did not continue this - Mom would like to try more natural approaches to treat it.  Currently uses natural saline sprays twice daily at home, reports they have helped with his symptoms.  Also use of humidifier in the home.  OBESITY / Hyperlipidemia Chronic obesity, has maintained his weight since last year.  Has improved diet, no sugars in the house, mom bakes only, likes vegetables, drinks plenty of water.  Mom has good understanding of his dietary needs and the importance of losing weight.  She is making appropriate changes at home for his weight loss.  Patient stays active at [facility] center with regular exercise, walks etc.  Last lipid panel done 2015. Labs redrawn today.    Health Maintenance -Requesting flu shot today -HIV screen in 2016 negative   ROS: See  HPI for pertinent ROS. All other systems reviewed and are negative.  PMFSH: Pertinent past medical, surgical, family, and social history were reviewed and updated as appropriate. Smoking status reviewed.  Objective:   BP 121/77   Pulse 76   Temp 98.3 F (36.8 C) (Oral)   Ht 5\' 8"  (1.727 m)   Wt 228 lb 6.4 oz (103.6 kg)   BMI 34.73 kg/m  Vitals and nursing note reviewed.  General: AOx3. Obese male, appears well-developed and well-nourished. No distress.  HEENT: NCAT, MMM, PERRL, EOMI, oropharynx clear, no rhinorrhea Neck: Normal ROM. Neck supple without LAD. No thyromegaly present.  Cardiovascular: RRR, no m/r/g Pulmonary: CTA B/L with normal effort Abdominal: Soft, obese. NT, ND +bs, no masses Musculoskeletal: FROMx4.  He exhibits no edema or tenderness.  Neurological: no focal deficits, strength and sensation 5/5 Skin: Skin is warm and dry. No rashes or lesions noted. Psychiatric: Mood appropriate.  Well groomed, poor eye contact, limited verbal response and engages in minimal conversation. Responds to questions appropriately.   Assessment & Plan:   40 y/o [place] presents to clinic for annual visit.  No complaints at the current time.   Autism -Patient remains involved in his community autism resource center -mom primary caregiver and POA -At this time, no concerns related to Autism   Allergic rhinitis -stable at current time -Spring/Fall environmental triggers - improved symptoms with natural saline nasal sprays BID -can consider discussing with mom to resume zyrtec if needed  Obesity (BMI 30-39.9) -diet at home has improved, is active  at community center -continue exercising and encourage weight loss  -check annual BMET and lipids today -continue to monitor  Health maintenance -Flu shot administered -Routine lipid panel, CBC, BMET labs drawn today -patient not sexually active   Orders Placed This Encounter  Procedures  . Flu Vaccine QUAD 36+  mos IM  . Lipid panel  . Basic Metabolic Panel  . CBC   Follow up: 1 year  Freddrick March, MD [facility], PGY-1 09/23/2015 7:01 PM

## 2015-09-24 LAB — LIPID PANEL
Cholesterol: 144 mg/dL (ref 125–200)
HDL: 36 mg/dL — ABNORMAL LOW (ref >=40)
LDL CALC: 94 mg/dL (ref <130)
Total CHOL/HDL Ratio: 4 Ratio (ref <=5.0)
Triglycerides: 71 mg/dL (ref <150)
VLDL: 14 mg/dL (ref <30)

## 2015-09-24 LAB — BASIC METABOLIC PANEL
BUN: 13 mg/dL (ref 7–25)
CHLORIDE: 103 mmol/L (ref 98–110)
CO2: 27 mmol/L (ref 20–31)
Calcium: 9.2 mg/dL (ref 8.6–10.3)
Creat: 1.01 mg/dL (ref 0.60–1.35)
Glucose, Bld: 81 mg/dL (ref 65–99)
Potassium: 4.3 mmol/L (ref 3.5–5.3)
SODIUM: 141 mmol/L (ref 135–146)

## 2017-09-30 ENCOUNTER — Telehealth: Payer: Self-pay | Admitting: Family Medicine

## 2017-09-30 NOTE — Telephone Encounter (Signed)
Left general message for pt regarding health maintenance reminder appt.

## 2017-10-23 ENCOUNTER — Ambulatory Visit (INDEPENDENT_AMBULATORY_CARE_PROVIDER_SITE_OTHER): Payer: Medicare HMO | Admitting: Family Medicine

## 2017-10-23 ENCOUNTER — Other Ambulatory Visit: Payer: Self-pay

## 2017-10-23 VITALS — BP 140/82 | HR 77 | Temp 97.9°F | Ht 68.0 in | Wt 232.2 lb

## 2017-10-23 DIAGNOSIS — Z23 Encounter for immunization: Secondary | ICD-10-CM | POA: Diagnosis not present

## 2017-10-23 DIAGNOSIS — Z0000 Encounter for general adult medical examination without abnormal findings: Secondary | ICD-10-CM | POA: Diagnosis not present

## 2017-10-23 MED ORDER — TETANUS-DIPHTH-ACELL PERTUSSIS 5-2-15.5 LF-MCG/0.5 IM SUSP: 0.5000 mL | Freq: Once | INTRAMUSCULAR | 0 refills | Status: AC

## 2017-10-23 NOTE — Progress Notes (Signed)
t  Subjective:   Patient ID: Brian Livingston    DOB: 1975-11-23, 42 y.o. male   MRN: [id number]  CC: routine physical, health maintenance  HPI: Brian Livingston is a 42 y.o. male who presents to clinic today for the following.  Present today with his mother who is primary caretaker and POA.   PMH of autism and is present today for annual routine physical.  Doing well with no current concerns.  Mother states he needs his flu shot today and addressed other health maintenance issues.   Health maintenance:  -due for flu shot -due for tetanus shot  ROS: No fever, chills, nausea, vomiting.  No CP, SOB, leg swelling.   Social: pt is a never smoker.  Medications reviewed. Objective:   BP 140/82   Pulse 77   Temp 97.9 F (36.6 C) (Oral)   Ht 5\' 8"  (1.727 m)   Wt 232 lb 3.2 oz (105.3 kg)   SpO2 98%   BMI 35.31 kg/m  Vitals and nursing note reviewed.  General: 42 yo male, NAD, minimally verbal, poor eye contact HEENT: NCAT, EOMI, PERRL, MMM, oropharynx clear Neck: supple, non-tender, normal ROM, no LAD  CV: RRR no MRG  Lungs: CTAB, normal effort  Abdomen: soft, NTND, +bs  Skin: warm, dry, no rash Extremities: warm and well perfused, normal tone  Assessment & Plan:   42 yo male presents for annual physical. No new concerns.  The following health maintenance issues were addressed.   Health maintenance: -tdap -flu shot given  Orders Placed This Encounter  Procedures  . Flu Vaccine QUAD 36+ mos IM   Meds ordered this encounter  Medications  . Tdap (ADACEL) 05-02-13.5 LF-MCG/0.5 injection    Sig: Inject 0.5 mLs into the muscle once for 1 dose.    Dispense:  0.5 mL    Refill:  0   Freddrick March, MD [facility] PGY-3

## 2017-10-23 NOTE — Patient Instructions (Signed)
It was nice meeting you today.  Brian Livingston was seen in clinic for a routine annual physical and is doing great.  He received his flu shot and we discussed a tetanus booster.  He may follow-up in 1 year or sooner if needed.  Please call clinic if you have any questions.  Freddrick March MD

## 2017-10-29 ENCOUNTER — Encounter: Payer: Self-pay | Admitting: Family Medicine

## 2017-11-06 NOTE — Addendum Note (Signed)
Addended by: Freddrick March on: 11/06/2017 11:52 PM   Modules accepted: Level of Service

## 2018-10-31 ENCOUNTER — Other Ambulatory Visit: Payer: Self-pay

## 2018-10-31 ENCOUNTER — Encounter: Payer: Self-pay | Admitting: Student in an Organized Health Care Education/Training Program

## 2018-10-31 ENCOUNTER — Ambulatory Visit (INDEPENDENT_AMBULATORY_CARE_PROVIDER_SITE_OTHER): Payer: Medicare HMO | Admitting: Student in an Organized Health Care Education/Training Program

## 2018-10-31 VITALS — BP 110/60 | HR 93 | Ht 68.0 in | Wt 252.2 lb

## 2018-10-31 DIAGNOSIS — Z0000 Encounter for general adult medical examination without abnormal findings: Secondary | ICD-10-CM | POA: Diagnosis not present

## 2018-10-31 MED ORDER — TETANUS-DIPHTH-ACELL PERTUSSIS 5-2.5-18.5 LF-MCG/0.5 IM SUSP: 0.5000 mL | Freq: Once | INTRAMUSCULAR | 0 refills | Status: AC

## 2018-10-31 NOTE — Progress Notes (Signed)
   Subjective:    Patient ID: Brian Livingston, male    DOB: 09-13-1975, 43 y.o.   MRN: [id number]  CC: Physical  HPI:  History is provided by mother Lamona Curl).  Patient prefers to go by Brian Livingston.  Mother denies any specific concerns or complaints today. Patient is doing well overall and lives at home.  He is wearing his mask and washing his hands frequently whenever leaving the home. He received his flu shot on 3 October. Patient sees a dentist every 6 months for cleaning and has an appointment in about a month. He also sees an eye doctor annually. Patient has wellness visits with [facility] every 6 months. The only medication that patient takes is for seasonal allergies including fluticasone and saline 1-2 times daily.  Smoking status reviewed   ROS: pertinent noted in the HPI   I have personally reviewed pertinent past medical history, surgical, family, and social history as appropriate.  Objective:  BP 110/60   Pulse 93   Ht 5\' 8"  (1.727 m)   Wt 252 lb 4 oz (114.4 kg)   SpO2 97%   BMI 38.35 kg/m   Vitals and nursing note reviewed  General: NAD, pleasant, able to participate in exam Head: [place]/AT.   Eyes:  EOMI unable to be tested, patient has extreme upward and fixed gaze. PERRL.   Ears:  External ears WNL, left TM normal without retraction, redness or bulging.  Unable to examine right TM with cerumen obstruction.  Patient denied tenderness with exam. Nose:  Septum midline  Mouth:  MMM, tonsils non-erythematous, non-edematous.  Tongue has hyperpigmented geographic lesions which have been present forever and patient's mother has the same lesions. Cardiac: RRR, S1 S2 present. normal heart sounds, no murmurs. Respiratory: CTAB, normal effort, No wheezes, rales or rhonchi Extremities: Bilateral 1+ pitting edema in lower extremities to mid tibia. Skin: warm and dry, no rashes noted Neuro: alert, no obvious focal deficits Psych: Able to respond to questions with sounds and  appropriate response.  Does not make eye contact.  Assessment & Plan:   Annual physical exam History and exam unremarkable with exception of pitting lower extremity edema bilaterally. -Checking lipid panel, A1c, BMP today. -Prescribing Tdap to get at pharmacy. -Recommend annual wellness visit  Orders Placed This Encounter  Procedures  . Hemoglobin A1c  . Lipid Panel  . Basic Metabolic Panel    Meds ordered this encounter  Medications  . Tdap (BOOSTRIX) 5-2.5-18.5 LF-MCG/0.5 injection    Sig: Inject 0.5 mLs into the muscle once for 1 dose.    Dispense:  0.5 mL    Refill:  0   Doristine Mango, [place] PGY-2

## 2018-10-31 NOTE — Assessment & Plan Note (Signed)
History and exam unremarkable with exception of pitting lower extremity edema bilaterally. -Checking lipid panel, A1c, BMP today. -Prescribing Tdap to get at pharmacy. -Recommend annual wellness visit

## 2018-10-31 NOTE — Patient Instructions (Signed)
It was a pleasure to see you today!  To summarize our discussion for this visit:  Somebody here that you are doing so well.  Today I am giving you a prescription for Tdap which you can get at your pharmacy.  We will test for cholesterol, A1c her blood sugars and kidney function.  You can also get an annual wellness visit here if he would like to just call the clinic to schedule   Call the clinic at [phone] if your symptoms worsen or you have any concerns.   Thank you for allowing me to take part in your care,  Dr. Doristine Mango

## 2018-11-01 LAB — BASIC METABOLIC PANEL
BUN/Creatinine Ratio: 13 (ref 9–20)
BUN: 14 mg/dL (ref 6–24)
CO2: 25 mmol/L (ref 20–29)
Calcium: 9.5 mg/dL (ref 8.7–10.2)
Chloride: 101 mmol/L (ref 96–106)
Creatinine, Ser: 1.05 mg/dL (ref 0.76–1.27)
GFR calc Af Amer: 99 mL/min/{1.73_m2} (ref >59)
GFR calc non Af Amer: 85 mL/min/{1.73_m2} (ref >59)
Glucose: 86 mg/dL (ref 65–99)
Potassium: 4.5 mmol/L (ref 3.5–5.2)
Sodium: 138 mmol/L (ref 134–144)

## 2018-11-01 LAB — LIPID PANEL
Chol/HDL Ratio: 3.6 ratio (ref 0.0–5.0)
Cholesterol, Total: 169 mg/dL (ref 100–199)
HDL: 47 mg/dL (ref >39)
LDL Chol Calc (NIH): 104 mg/dL — ABNORMAL HIGH (ref 0–99)
Triglycerides: 98 mg/dL (ref 0–149)
VLDL Cholesterol Cal: 18 mg/dL (ref 5–40)

## 2018-11-01 LAB — HEMOGLOBIN A1C
Est. average glucose Bld gHb Est-mCnc: 120 mg/dL
Hgb A1c MFr Bld: 5.8 % — ABNORMAL HIGH (ref 4.8–5.6)

## 2019-11-05 ENCOUNTER — Other Ambulatory Visit: Payer: Self-pay

## 2019-11-05 ENCOUNTER — Encounter: Payer: Self-pay | Admitting: Family Medicine

## 2019-11-05 ENCOUNTER — Ambulatory Visit (INDEPENDENT_AMBULATORY_CARE_PROVIDER_SITE_OTHER): Payer: Medicare HMO | Admitting: Family Medicine

## 2019-11-05 VITALS — BP 114/80 | HR 76 | Wt 252.2 lb

## 2019-11-05 DIAGNOSIS — Z0000 Encounter for general adult medical examination without abnormal findings: Secondary | ICD-10-CM | POA: Diagnosis not present

## 2019-11-05 DIAGNOSIS — R7309 Other abnormal glucose: Secondary | ICD-10-CM

## 2019-11-05 DIAGNOSIS — H6123 Impacted cerumen, bilateral: Secondary | ICD-10-CM | POA: Diagnosis not present

## 2019-11-05 DIAGNOSIS — H6120 Impacted cerumen, unspecified ear: Secondary | ICD-10-CM | POA: Insufficient documentation

## 2019-11-05 HISTORY — DX: Other abnormal glucose: R73.09

## 2019-11-05 HISTORY — DX: Impacted cerumen, unspecified ear: H61.20

## 2019-11-05 NOTE — Assessment & Plan Note (Addendum)
A1c mildly elevated at 5.8 last year. Given patient's obesity and prior hx of elevated A1c, will recheck A1c today.

## 2019-11-05 NOTE — Progress Notes (Signed)
    SUBJECTIVE:   CHIEF COMPLAINT / HPI:   Annual Physical History is provided by mother due to autism and limited verbal expression by patient.  She does not have any concerns today. She does report patient has been sedentary and spending much more time indoors since COVID and has put on some weight. His weight is unchanged from his visit 1 year ago, but is up 20 lbs from the year prior (2019). Patient used to go to [facility] on a regular basis prior to the pandemic but has not been in ~1.5 years. They are waiting on transportation paperwork to be approved, but plan to go back later this month.  PMHx: Autism, obesity, seasonal allergies Meds: None PSHx: None Social Hx: lives with Mom and two brothers. No tobacco, alcohol or other substance use. Not sexually active. Is independent with ADLs but dependent on Mom for all iADLs. Nearly nonverbal at baseline.  Health Maintenance: Patient is up to date on immunizations, including COVID and flu vaccines. Mom is agreeable to performing Hep C screening today.   PERTINENT  PMH / PSH: Autism, obesity, seasonal allergies  OBJECTIVE:   BP 114/80   Pulse 76   Wt 252 lb 3.2 oz (114.4 kg)   SpO2 97%   BMI 38.35 kg/m   Gen: alert, well-appearing, NAD HEENT: mild edema of R nasal turbinate, oropharynx clear, cerumen impaction of bilateral TM Neck: no cervical lymphadenopathy CV: RRR, normal S1/S2 without m/r/g Resp: normal WOB, lungs CTAB Abd: +BS, soft, nontender, nondistended Ext: 1+ pitting edema in bilateral lower extremities Neuro: grossly intact   ASSESSMENT/PLAN:   Annual physical exam History and exam unremarkable other than obesity and mild pitting edema of bilateral lower extremities (unchanged from last year). BMP and lipid panel obtained last year (10/31/2018) were wnl. -Encouraged physical activity at [facility] -Will obtain Hep C screening today -Recommend annual wellness visit  Other abnormal glucose A1c mildly elevated  at 5.8 last year. Given patient's obesity and prior hx of elevated A1c, will recheck A1c today.  Cerumen impaction No concerns about patient's hearing. Large amount of wax removed manually with curette from bilateral ears. Still with mild cerumen impaction but patient and mother declined irrigation as patient is unable to tolerate it.  Discussed case with Dr. Iva Boop, MD [facility]

## 2019-11-05 NOTE — Assessment & Plan Note (Signed)
History and exam unremarkable other than obesity and mild pitting edema of bilateral lower extremities (unchanged from last year). BMP and lipid panel obtained last year (10/31/2018) were wnl. -Encouraged physical activity at [facility] -Will obtain Hep C screening today -Recommend annual wellness visit

## 2019-11-05 NOTE — Patient Instructions (Signed)
It was great to meet you!  Our plans for today:  - We are checking his hemoglobin A1C and Hepatitis C today. I will send you a letter with the results, or call you if they are abnormal.  Take care and seek immediate care sooner if you develop any concerns.   Dr. [facility]

## 2019-11-05 NOTE — Assessment & Plan Note (Signed)
No concerns about patient's hearing. Large amount of wax removed manually with curette from bilateral ears. Still with mild cerumen impaction but patient and mother declined irrigation as patient is unable to tolerate it.

## 2019-11-06 ENCOUNTER — Encounter: Payer: Self-pay | Admitting: Family Medicine

## 2019-11-06 LAB — HEPATITIS C ANTIBODY: Hep C Virus Ab: <0.1 s/co ratio (ref 0.0–0.9)

## 2019-11-06 LAB — HEMOGLOBIN A1C
Est. average glucose Bld gHb Est-mCnc: 114 mg/dL
Hgb A1c MFr Bld: 5.6 % (ref 4.8–5.6)

## 2020-11-14 ENCOUNTER — Encounter: Payer: Self-pay | Admitting: Student

## 2020-11-14 ENCOUNTER — Ambulatory Visit (INDEPENDENT_AMBULATORY_CARE_PROVIDER_SITE_OTHER): Payer: Medicare Other | Admitting: Student

## 2020-11-14 ENCOUNTER — Other Ambulatory Visit: Payer: Self-pay

## 2020-11-14 DIAGNOSIS — H6120 Impacted cerumen, unspecified ear: Secondary | ICD-10-CM | POA: Diagnosis not present

## 2020-11-14 NOTE — Assessment & Plan Note (Signed)
Some cerumen was removed using curette. Irrigation was attempted but pt could not tolerate. Pts mother was advised to purchase ear wax removal drops and return to attempt again

## 2020-11-14 NOTE — Patient Instructions (Addendum)
It was great to see you! Thank you for allowing me to participate in your care!  I recommend that you always bring your medications to each appointment as this makes it easy to ensure you are on the correct medications and helps [place] not miss when refills are needed.  Our plans for today:  - Please consider colon cancer screening. You do not need an appointment if  this is something you would like to do in the near future.  You can call and leave a message for me and I can order the tests for you.  -Please return in 1 year for your next annual exam   Take care and seek immediate care sooner if you develop any concerns.   Dr. Erick Alley, DO [facility]

## 2020-11-14 NOTE — Progress Notes (Signed)
    SUBJECTIVE:   CHIEF COMPLAINT / HPI:   Annual Physical  Hx provided by mother d/t limited verbal expression secondary to autism  PERTINENT  PMH / PSH: Autism, obesity, seasonal allergies  Pt has been able to resume going to [facility] since Spring of this year (was not going previously d/t COVID). This is going well.  Mother states pt has been having behavior problems. Likes to accumulate toilet paper/paper towels (sometimes whole rolls) at home, church and [facility] and hide it in his pockets, lunch pail and under his bed. This behavior has been going on for years. Has tantrums at grocery store when mother says he can't have snacks he wants. Has not been able to take to store for past few years. Mother is stressed about always needing to care for pt as he is very reliant on her. Her other son has been helping more but makes comments he may not always be living with them to help.   OBJECTIVE:   Vitals:   11/14/20 1335  BP: 124/84  Pulse: 77  SpO2: 100%     General: NAD, pleasant, able to participate in exam Cardiac: RRR, no murmurs. HEENT: hyperpigmented spots on tongue, MMM, cerumen impaction of bilateral ear canals Respiratory: CTAB, normal effort, No wheezes, rales or rhonchi Abdomen: Bowel sounds present, nontender, soft Extremities: +1 pitting edema of BLEs Neuro: alert, no obvious focal deficits   ASSESSMENT/PLAN:   Autism  Mother seems very concerned about pt taking large amounts of toilet paper from [facility] and [facility] and his tantrums. This behavior has been going on for years. We discussed monitoring pt when possible but also acknowledged that this behavior likely would be very difficult to change.   Cerumen impaction Some cerumen was removed using curette. Irrigation was attempted but pt could not tolerate. Pts mother was advised to purchase ear wax removal drops and return to attempt again   Health maintenance/ annual PE Pitting edema of BLEs seems unchanged  from last year Mother does not want to do any colon cancer screening at this time Mother does not want COVID booster at this time  No blood work needed today as BMP and lipid panel from 2020 were normal. HgbA1c from last year also WNL.   Dr. Erick Alley, DO [facility]

## 2021-03-28 ENCOUNTER — Telehealth: Payer: Self-pay | Admitting: Student

## 2021-03-28 NOTE — Telephone Encounter (Signed)
Patient dropped off form at front desk for [facility] services .  Verified that patient section of form has been completed.  Last DOS/WCC with PCP was 11/14/20.  Placed form in red team folder to be completed by clinical staff.  Vilinda Blanks  ?

## 2021-03-28 NOTE — Telephone Encounter (Signed)
Reviewed form and placed in PCP's box for completion.  .Lucca Greggs R [place] Wadas, CMA  

## 2021-03-29 NOTE — Telephone Encounter (Signed)
Form placed up front for pick up.  ? ?Attempted to call patient, however no answer or option for VM. ? ?Copy made for batch scanning.  ?

## 2021-11-12 NOTE — Progress Notes (Deleted)
    SUBJECTIVE:   Chief compliant/HPI: annual examination  Brian Livingston is a 46 y.o. who presents today for an annual exam.   History tabs reviewed and updated ***.   Review of systems form reviewed and notable for ***.   OBJECTIVE:   There were no vitals taken for this visit.  ***  ASSESSMENT/PLAN:   No problem-specific Assessment & Plan notes found for this encounter.    Annual Examination  See AVS for recommendations.  PHQ score ***, reviewed.  Blood pressure value is *** goal.  Advanced directives ***   Considered the following screening exams based upon USPSTF recommendations: Diabetes screening: discussed hemoglobin A1c normal in 2021 Screening for elevated cholesterol: discussed HIV testing: discussed, negative in 2016 Hepatitis C: discussed, negative in 2021 Syphilis if at high risk: {discussed/ordered:14545} Reviewed risk factors for latent tuberculosis and {not indicated/requested/declined:14582} Colorectal cancer screening: {crcscreen:23821::"discussed, colonoscopy ordered"} if age 1 or over or risk factors.  Immunizations ***.   Follow up in 1 *** year or sooner if indicated.    Erick Alley, DO [place] [facility]

## 2021-11-14 ENCOUNTER — Encounter: Payer: Medicare Other | Admitting: Student

## 2021-11-20 ENCOUNTER — Ambulatory Visit (INDEPENDENT_AMBULATORY_CARE_PROVIDER_SITE_OTHER): Payer: Medicare Other | Admitting: Student

## 2021-11-20 ENCOUNTER — Encounter: Payer: Self-pay | Admitting: Student

## 2021-11-20 VITALS — BP 116/84 | HR 86 | Temp 98.7°F | Wt 217.2 lb

## 2021-11-20 DIAGNOSIS — Z0000 Encounter for general adult medical examination without abnormal findings: Secondary | ICD-10-CM

## 2021-11-20 DIAGNOSIS — Z23 Encounter for immunization: Secondary | ICD-10-CM | POA: Diagnosis not present

## 2021-11-20 NOTE — Patient Instructions (Addendum)
It was great to see you! Thank you for allowing me to participate in your care!  Our plans for today:  - Return in 1 year for the next wellness exam or sooner if needed.  - For ear wax, you can use over the counter debrox ear drops or return to have his ear cleaned if needed.   Take care and seek immediate care sooner if you develop any concerns.   Dr. Erick Alley, DO [facility]

## 2021-11-20 NOTE — Progress Notes (Unsigned)
    SUBJECTIVE:   Chief compliant/HPI: annual examination  Brian Livingston is a 46 y.o. who presents today for an annual exam.   History tabs reviewed and updated .   Has allergic rhinitis but mom states they under control with saline spray which she states helps.    OBJECTIVE:   Vitals:   11/20/21 1351  BP: 116/84  Pulse: 86  Temp: 98.7 F (37.1 C)  SpO2: 98%   Physical exam General: 46 year old male, NAD HEENT: White sclera, clear conjunctiva, unable to view TMs due to cerumen, MMM Cardio: RRR, normal S1/S2 Lungs: CTAB, normal effort Abdomen: Bowel sounds present, soft, nontender to palpation, nondistended Skin: Warm and dry Neuro: No focal deficits   ASSESSMENT/PLAN:   Annual Examination  Blood pressure value is at goal.  Considered the following screening exams based upon USPSTF recommendations: Diabetes screening: Hemoglobin A1c of 5.6 in 2021 Screening for elevated cholesterol: discussed. Previously checked in 2020. Mother would like to hold off on a lipid panel. HIV testing: Nonreactive in 2016 Hepatitis C: Negative in 2021 Reviewed risk factors for latent tuberculosis and not indicated Colorectal cancer screening: discussed and declined today.   Immunizations flu and COVID given today.   Follow up in 1  year or sooner if indicated.    Erick Alley, DO [place] [facility]

## 2022-02-19 NOTE — Patient Instructions (Signed)
Health Maintenance, Male Adopting a healthy lifestyle and getting preventive care are important in promoting health and wellness. Ask your health care provider about: The right schedule for you to have regular tests and exams. Things you can do on your own to prevent diseases and keep yourself healthy. What should I know about diet, weight, and exercise? Eat a healthy diet  Eat a diet that includes plenty of vegetables, fruits, low-fat dairy products, and lean protein. Do not eat a lot of foods that are high in solid fats, added sugars, or sodium. Maintain a healthy weight Body mass index (BMI) is a measurement that can be used to identify possible weight problems. It estimates body fat based on height and weight. Your health care provider can help determine your BMI and help you achieve or maintain a healthy weight. Get regular exercise Get regular exercise. This is one of the most important things you can do for your health. Most adults should: Exercise for at least 150 minutes each week. The exercise should increase your heart rate and make you sweat (moderate-intensity exercise). Do strengthening exercises at least twice a week. This is in addition to the moderate-intensity exercise. Spend less time sitting. Even light physical activity can be beneficial. Watch cholesterol and blood lipids Have your blood tested for lipids and cholesterol at 49 years of age, then have this test every 5 years. You may need to have your cholesterol levels checked more often if: Your lipid or cholesterol levels are high. You are older than 49 years of age. You are at high risk for heart disease. What should I know about cancer screening? Many types of cancers can be detected early and may often be prevented. Depending on your health history and family history, you may need to have cancer screening at various ages. This may include screening for: Colorectal cancer. Prostate cancer. Skin cancer. Lung  cancer. What should I know about heart disease, diabetes, and high blood pressure? Blood pressure and heart disease High blood pressure causes heart disease and increases the risk of stroke. This is more likely to develop in people who have high blood pressure readings or are overweight. Talk with your health care provider about your target blood pressure readings. Have your blood pressure checked: Every 3-5 years if you are 18-39 years of age. Every year if you are 40 years old or older. If you are between the ages of 65 and 75 and are a current or former smoker, ask your health care provider if you should have a one-time screening for abdominal aortic aneurysm (AAA). Diabetes Have regular diabetes screenings. This checks your fasting blood sugar level. Have the screening done: Once every three years after age 45 if you are at a normal weight and have a low risk for diabetes. More often and at a younger age if you are overweight or have a high risk for diabetes. What should I know about preventing infection? Hepatitis B If you have a higher risk for hepatitis B, you should be screened for this virus. Talk with your health care provider to find out if you are at risk for hepatitis B infection. Hepatitis C Blood testing is recommended for: Everyone born from 1945 through 1965. Anyone with known risk factors for hepatitis C. Sexually transmitted infections (STIs) You should be screened each year for STIs, including gonorrhea and chlamydia, if: You are sexually active and are younger than 49 years of age. You are older than 49 years of age and your   health care provider tells you that you are at risk for this type of infection. Your sexual activity has changed since you were last screened, and you are at increased risk for chlamydia or gonorrhea. Ask your health care provider if you are at risk. Ask your health care provider about whether you are at high risk for HIV. Your health care provider  may recommend a prescription medicine to help prevent HIV infection. If you choose to take medicine to prevent HIV, you should first get tested for HIV. You should then be tested every 3 months for as long as you are taking the medicine. Follow these instructions at home: Alcohol use Do not drink alcohol if your health care provider tells you not to drink. If you drink alcohol: Limit how much you have to 0-2 drinks a day. Know how much alcohol is in your drink. In the [place] one drink equals one 12 oz bottle of beer (355 mL), one 5 oz glass of wine (148 mL), or one 1 oz glass of hard liquor (44 mL). Lifestyle Do not use any products that contain nicotine or tobacco. These products include cigarettes, chewing tobacco, and vaping devices, such as e-cigarettes. If you need help quitting, ask your health care provider. Do not use street drugs. Do not share needles. Ask your health care provider for help if you need support or information about quitting drugs. General instructions Schedule regular health, dental, and eye exams. Stay current with your vaccines. Tell your health care provider if: You often feel depressed. You have ever been abused or do not feel safe at home. Summary Adopting a healthy lifestyle and getting preventive care are important in promoting health and wellness. Follow your health care provider's instructions about healthy diet, exercising, and getting tested or screened for diseases. Follow your health care provider's instructions on monitoring your cholesterol and blood pressure. This information is not intended to replace advice given to you by your health care provider. Make sure you discuss any questions you have with your health care provider. Document Revised: 05/09/2020 Document Reviewed: 05/09/2020 Elsevier Patient Education  2023 Elsevier Inc.  

## 2022-02-19 NOTE — Progress Notes (Signed)
The patient's appointment has to be rescheduled to a later date because he goes to a day program and doesn't get home until after 4 pm.

## 2022-02-20 ENCOUNTER — Ambulatory Visit (INDEPENDENT_AMBULATORY_CARE_PROVIDER_SITE_OTHER): Payer: 59

## 2022-02-20 DIAGNOSIS — Z0000 Encounter for general adult medical examination without abnormal findings: Secondary | ICD-10-CM

## 2022-09-28 ENCOUNTER — Ambulatory Visit (INDEPENDENT_AMBULATORY_CARE_PROVIDER_SITE_OTHER): Payer: 59

## 2022-09-28 VITALS — Wt 217.0 lb

## 2022-09-28 DIAGNOSIS — Z0000 Encounter for general adult medical examination without abnormal findings: Secondary | ICD-10-CM

## 2022-09-28 DIAGNOSIS — Z5320 Procedure and treatment not carried out because of patient's decision for unspecified reasons: Secondary | ICD-10-CM

## 2022-09-28 NOTE — Patient Instructions (Signed)
Brian Livingston , Thank you for taking time to come for your Medicare Wellness Visit. I appreciate your ongoing commitment to your health goals. Please review the following plan we discussed and let me know if I can assist you in the future.   Referrals/Orders/Follow-Ups/Clinician Recommendations: Aim for 30 minutes of exercise or brisk walking, 6-8 glasses of water, and 5 servings of fruits and vegetables each day.  This is a list of the screening recommended for you and due dates:  Health Maintenance  Topic Date Due   Colon Cancer Screening  Never done   Flu Shot  08/02/2022   Medicare Annual Wellness Visit  09/28/2023   DTaP/Tdap/Td vaccine (3 - Td or Tdap) 11/02/2027   COVID-19 Vaccine  Completed   Hepatitis C Screening  Completed   HIV Screening  Completed   HPV Vaccine  Aged Out    Advanced directives: (ACP Link)Information on Advanced Care Planning can be found at [facility] Advance Health Care Directives Advance Health Care Directives ([email or web address]   Next Medicare Annual Wellness Visit scheduled for next year: Yes

## 2022-09-28 NOTE — Progress Notes (Signed)
Subjective:   Brian Livingston is a 47 y.o. male who presents for an Initial Medicare Annual Wellness Visit.  Visit Complete: Virtual  I connected with  [place] on 09/28/22 by a audio enabled telemedicine application and verified that I am speaking with the correct person using two identifiers.  Patient Location: Home  Provider Location: Home Office  I discussed the limitations of evaluation and management by telemedicine. The patient expressed understanding and agreed to proceed.  Because this visit was a virtual/telehealth visit, some criteria may be missing or patient reported. Any vitals not documented were not able to be obtained and vitals that have been documented are patient reported.   Cardiac Risk Factors include: male gender     Objective:    Today's Vitals   09/28/22 1450  Weight: 217 lb (98.4 kg)   Body mass index is 32.99 kg/m.     09/28/2022    3:02 PM 11/14/2020    2:51 PM 11/05/2019    1:41 PM 10/23/2017    9:28 AM 09/23/2015    1:49 PM 09/17/2014   10:18 AM  Advanced Directives  Does Patient Have a Medical Advance Directive? No No No No Yes Yes  Type of Theatre manager of Healthcare Power of Attorney in Chart?     No - copy requested No - copy requested  Would patient like information on creating a medical advance directive? Yes (MAU/Ambulatory/Procedural Areas - Information given) No - Patient declined No - Patient declined No - Patient declined      Current Medications (verified) No outpatient encounter medications on file as of 09/28/2022.   No facility-administered encounter medications on file as of 09/28/2022.    Allergies (verified) Patient has no known allergies.   History: Past Medical History:  Diagnosis Date   Autism    History reviewed. No pertinent surgical history. Family History  Problem Relation Age of Onset   Diabetes Father    Kidney disease Father    Diabetes  Sister    Diabetes Brother    Social History   Socioeconomic History   Marital status: Single    Spouse name: Not on file   Number of children: Not on file   Years of education: Not on file   Highest education level: Not on file  Occupational History   Not on file  Tobacco Use   Smoking status: Never   Smokeless tobacco: Never  Substance and Sexual Activity   Alcohol use: Never   Drug use: Never   Sexual activity: Never  Other Topics Concern   Not on file  Social History Narrative   Not on file   Social Determinants of Health   Financial Resource Strain: Low Risk  (09/28/2022)   Overall Financial Resource Strain (CARDIA)    Difficulty of Paying Living Expenses: Not hard at all  Food Insecurity: No Food Insecurity (09/28/2022)   Hunger Vital Sign    Worried About Running Out of Food in the Last Year: Never true    Ran Out of Food in the Last Year: Never true  Transportation Needs: No Transportation Needs (09/28/2022)   PRAPARE - Administrator, Civil Service (Medical): No    Lack of Transportation (Non-Medical): No  Physical Activity: Insufficiently Active (09/28/2022)   Exercise Vital Sign    Days of Exercise per Week: 3 days    Minutes of Exercise per Session: 30 min  Stress: No Stress Concern Present (09/28/2022)   [employer] of Occupational Health - Occupational Stress Questionnaire    Feeling of Stress : Not at all  Social Connections: Socially Isolated (09/28/2022)   Social Connection and Isolation Panel [NHANES]    Frequency of Communication with Friends and Family: Never    Frequency of Social Gatherings with Friends and Family: Once a week    Attends Religious Services: More than 4 times per year    Active Member of [employer] or Organizations: No    Attends Engineer, structural: Never    Marital Status: Never married    Tobacco Counseling Counseling given: Not Answered   Clinical Intake:  Pre-visit preparation completed:  Yes  Pain : No/denies pain     Diabetes: No  How often do you need to have someone help you when you read instructions, pamphlets, or other written materials from your doctor or pharmacy?: 1 - Never  Interpreter Needed?: No  Comments: Assisted with visit by mother [place] Information entered by :: Kandis Fantasia LPN   Activities of Daily Living    09/28/2022    2:59 PM  In your present state of health, do you have any difficulty performing the following activities:  Hearing? 0  Vision? 0  Difficulty concentrating or making decisions? 1  Walking or climbing stairs? 0  Dressing or bathing? 0  Doing errands, shopping? 1  Preparing Food and eating ? N  Using the Toilet? N  In the past six months, have you accidently leaked urine? N  Do you have problems with loss of bowel control? N  Managing your Medications? N  Managing your Finances? Y  Housekeeping or managing your Housekeeping? N    Patient Care Team: Erick Alley, DO as PCP - General (Family Medicine)  Indicate any recent Medical Services you may have received from other than [facility] providers in the past year (date may be approximate).     Assessment:   This is a routine wellness examination for [employer].  Hearing/Vision screen Hearing Screening - Comments:: Denies hearing difficulties   Vision Screening - Comments:: No vision problems; will schedule routine eye exam soon     Goals Addressed             This Visit's Progress    DIET - EAT MORE FRUITS AND VEGETABLES        Depression Screen    09/28/2022    3:00 PM 11/14/2020    2:51 PM 11/05/2019    1:40 PM 10/23/2017    9:28 AM 09/23/2015    1:50 PM 09/17/2014   10:18 AM  PHQ 2/9 Scores  PHQ - 2 Score 0 0 0 0 0   PHQ- 9 Score  0 0     Exception Documentation      Medical reason    Fall Risk    09/28/2022    3:03 PM 11/05/2019    1:40 PM 10/23/2017    9:28 AM 09/17/2014   10:18 AM  Fall Risk   Falls in the past year? 0 0 No No  Number falls  in past yr: 0 0    Injury with Fall? 0 0    Risk for fall due to : No Fall Risks     Follow up Falls prevention discussed;Education provided;Falls evaluation completed       MEDICARE RISK AT HOME: Medicare Risk at Home Any stairs in or around the home?: No If so, are there any without handrails?: No  Home free of loose throw rugs in walkways, pet beds, electrical cords, etc?: Yes Adequate lighting in your home to reduce risk of falls?: Yes Life alert?: No Use of a cane, walker or w/c?: No Grab bars in the bathroom?: Yes Shower chair or bench in shower?: No Elevated toilet seat or a handicapped toilet?: No  TIMED UP AND GO:  Was the test performed? No    Cognitive Function:    09/28/2022    3:03 PM  MMSE - Mini Mental State Exam  Not completed: Unable to complete        Immunizations Immunization History  Administered Date(s) Administered   Influenza Whole 10/11/2008   Influenza,inj,Quad PF,6+ Mos 10/31/2012, 10/23/2013, 09/17/2014, 09/23/2015, 10/23/2017, 10/03/2018, 11/20/2021   Influenza-Unspecified 10/04/2019, 11/12/2020   PFIZER(Purple Top)SARS-COV-2 Vaccination 03/26/2019, 04/16/2019   Pfizer(Comirnaty)Fall Seasonal Vaccine 12 years and older 11/20/2021   Td 07/01/2005   Tdap 11/01/2017    TDAP status: Up to date  Flu Vaccine status: Due, Education has been provided regarding the importance of this vaccine. Advised may receive this vaccine at local pharmacy or Health Dept. Aware to provide a copy of the vaccination record if obtained from local pharmacy or Health Dept. Verbalized acceptance and understanding.  Pneumococcal vaccine status: Up to date  Covid-19 vaccine status: Information provided on how to obtain vaccines.   Qualifies for Shingles Vaccine?  No     Screening Tests Health Maintenance  Topic Date Due   INFLUENZA VACCINE  08/02/2022   Colonoscopy  09/28/2023 (Originally 09/11/2018)   Medicare Annual Wellness (AWV)  09/28/2023   DTaP/Tdap/Td (3  - Td or Tdap) 11/02/2027   COVID-19 Vaccine  Completed   Hepatitis C Screening  Completed   HIV Screening  Completed   HPV VACCINES  Aged Out    Health Maintenance  Health Maintenance Due  Topic Date Due   INFLUENZA VACCINE  08/02/2022    Colorectal cancer screening:  Declines at this time   Lung Cancer Screening: (Low Dose CT Chest recommended if Age 49-80 years, 20 pack-year currently smoking OR have quit w/in 15years.) does not qualify.   Lung Cancer Screening Referral: n/a  Additional Screening:  Hepatitis C Screening: does qualify; Completed 11/05/19  Vision Screening: Recommended annual ophthalmology exams for early detection of glaucoma and other disorders of the eye. Is the patient up to date with their annual eye exam?  No  Who is the provider or what is the name of the office in which the patient attends annual eye exams? none If pt is not established with a provider, would they like to be referred to a provider to establish care? No .   Dental Screening: Recommended annual dental exams for proper oral hygiene  Community Resource Referral / Chronic Care Management: CRR required this visit?  No   CCM required this visit?  No    Plan:     I have personally reviewed and noted the following in the patient's chart:   Medical and social history Use of alcohol, tobacco or illicit drugs  Current medications and supplements including opioid prescriptions. Patient is not currently taking opioid prescriptions. Functional ability and status Nutritional status Physical activity Advanced directives List of other physicians Hospitalizations, surgeries, and ER visits in previous 12 months Vitals Screenings to include cognitive, depression, and falls Referrals and appointments  In addition, I have reviewed and discussed with patient certain preventive protocols, quality metrics, and best practice recommendations. A written personalized care plan for preventive services  as  well as general preventive health recommendations were provided to patient.     Kandis Fantasia [place], [place]   1/61/0960   After Visit Summary: (Mail) Due to this being a telephonic visit, the after visit summary with patients personalized plan was offered to patient via mail   Nurse Notes: No concerns at this time

## 2022-11-22 ENCOUNTER — Other Ambulatory Visit: Payer: Self-pay | Admitting: Family Medicine

## 2022-11-22 ENCOUNTER — Encounter: Payer: Self-pay | Admitting: Family Medicine

## 2022-11-22 ENCOUNTER — Ambulatory Visit: Payer: 59 | Admitting: Family Medicine

## 2022-11-22 VITALS — BP 120/75 | HR 72 | Wt 207.6 lb

## 2022-11-22 DIAGNOSIS — Z23 Encounter for immunization: Secondary | ICD-10-CM | POA: Diagnosis not present

## 2022-11-22 DIAGNOSIS — Z0000 Encounter for general adult medical examination without abnormal findings: Secondary | ICD-10-CM

## 2022-11-22 NOTE — Progress Notes (Cosign Needed Addendum)
    SUBJECTIVE:   CHIEF COMPLAINT / HPI:   Presenting for annual wellness visit.  Patient is accompanied by his mother who speaks for him.  He is up-to-date on all of his annual screenings except for his colonoscopy.  They are attempting to collect a Cologuard sample but the patient is very private and so his mother is having difficulty collecting the sample.  PERTINENT  PMH / PSH: Autism, allergic rhinitis  OBJECTIVE:   BP 120/75   Pulse 72   Wt 207 lb 9.6 oz (94.2 kg)   SpO2 100%   BMI 31.57 kg/m   General: A&O, NAD HEENT: No sign of trauma, EOM grossly intact Cardiac: RRR, no m/r/g Respiratory: CTAB, normal WOB, no w/c/r GI: Soft, NTTP, non-distended  Extremities: NTTP, no peripheral edema. Neuro: Normal gait, moves all four extremities appropriately. Psych: Appropriate mood and affect   ASSESSMENT/PLAN:   Annual physical exam Kavell is a very healthy 47 year old.  We will check a BMP today to monitor electrolytes as well as his sugars.  Patient is not on any prescribed medications and so does not need refills.  Patient can follow-up in 1 year or sooner if needed.     Gerrit Heck, DO [facility]

## 2022-11-22 NOTE — Patient Instructions (Signed)
It was wonderful to see you today!  Today was your annual wellness visit.  We discussed your allergies, as well as your appropriate screening tests.  Please continue to try and collect a Cologuard sample for his colorectal cancer screening.  We also drew a BMP today to monitor your electrolytes and to make sure that your sugars are not elevated.  Otherwise you are very healthy.  If you need to be seen sooner than next year please feel free to give [place] a call otherwise we will see you in 1 year for your annual physical.  Please call [phone] with any questions about today's appointment.   If you need any additional refills, please call your pharmacy before calling the office.  Gerrit Heck, DO Family Medicine

## 2022-11-22 NOTE — Assessment & Plan Note (Signed)
Brian Livingston is a very healthy 47 year old.  We will check a BMP today to monitor electrolytes as well as his sugars.  Patient is not on any prescribed medications and so does not need refills.  Patient can follow-up in 1 year or sooner if needed.

## 2022-11-22 NOTE — Progress Notes (Deleted)
    SUBJECTIVE:   CHIEF COMPLAINT / HPI:   Annual wellness visit Flu and covid done today Trying to do Cologuard, but having issues.   PERTINENT  PMH / PSH: ***  OBJECTIVE:   BP 120/75   Pulse 72   Wt 207 lb 9.6 oz (94.2 kg)   SpO2 100%   BMI 31.57 kg/m   ***  ASSESSMENT/PLAN:   No problem-specific Assessment & Plan notes found for this encounter.     Gerrit Heck, DO [facility]

## 2022-11-23 LAB — BASIC METABOLIC PANEL
BUN/Creatinine Ratio: 15 (ref 9–20)
BUN: 14 mg/dL (ref 6–24)
CO2: 23 mmol/L (ref 20–29)
Calcium: 9.5 mg/dL (ref 8.7–10.2)
Chloride: 104 mmol/L (ref 96–106)
Creatinine, Ser: 0.95 mg/dL (ref 0.76–1.27)
Glucose: 79 mg/dL (ref 70–99)
Potassium: 4.1 mmol/L (ref 3.5–5.2)
Sodium: 140 mmol/L (ref 134–144)
eGFR: 98 mL/min/{1.73_m2} (ref >59)

## 2022-11-26 ENCOUNTER — Encounter: Payer: Self-pay | Admitting: Family Medicine

## 2023-01-10 ENCOUNTER — Telehealth: Payer: Self-pay | Admitting: Student

## 2023-01-10 NOTE — Telephone Encounter (Signed)
 Uncle dropped off form at front desk for [place].  Verified that patient section of form has been completed.  Last DOS/WCC with PCP was 01/21/22.  Placed form in red team folder to be completed by clinical staff.  Brian Livingston

## 2023-01-11 NOTE — Telephone Encounter (Signed)
 Placed in MDs box to be filled out. Dalon Reichart Bruna Potter, CMA

## 2023-02-04 ENCOUNTER — Telehealth: Payer: Self-pay | Admitting: Student

## 2023-02-04 NOTE — Telephone Encounter (Signed)
Patient's mother called about the status of his transportation paperwork. Form was dropped off 01/10/23. She states that they need the paperwork sent in as soon as possible so that patient doesn't lose his transportation. Asks is someone could please call her

## 2023-02-05 NOTE — Telephone Encounter (Signed)
Form placed up front for pick up.   Copy made for batch scanning.   Mother has been made aware.  

## 2023-02-11 ENCOUNTER — Telehealth: Payer: Self-pay | Admitting: Student

## 2023-02-11 NOTE — Telephone Encounter (Signed)
 Patient's sister dropped off Access [place] paperwork, there was a part of question 5 that was not completed. Last DOS was 11/22/22., Placed in Red folder.

## 2023-04-02 ENCOUNTER — Telehealth: Payer: Self-pay | Admitting: Student

## 2023-04-02 NOTE — Telephone Encounter (Signed)
 Patients sister dropped off form at front desk for [employer].  Verified that patient section of form has been completed.  Last DOS/WCC with PCP was 11/22/2022.  Placed form in Red team folder to be completed by clinical staff.  Brian Livingston

## 2023-04-02 NOTE — Telephone Encounter (Signed)
 Form has been placed in your box to be completed. Penni Bombard CMA

## 2023-04-05 NOTE — Telephone Encounter (Signed)
 Forms found in RN box.   Copy made for scanning.   Form placed up front for pick up.  Mother has been made aware.

## 2023-05-30 ENCOUNTER — Telehealth: Payer: Self-pay

## 2023-05-30 NOTE — Telephone Encounter (Signed)
 Patients mother calls nurse line requesting a behavorial health referral.   She reports his program [facility] is requesting updated diagnoses to have on file.   She reports the last behavioral health record is from "the 75's."  Advised will forward to PCP.

## 2023-06-04 ENCOUNTER — Other Ambulatory Visit: Payer: Self-pay | Admitting: Student

## 2023-06-04 DIAGNOSIS — F840 Autistic disorder: Secondary | ICD-10-CM

## 2023-06-04 NOTE — Progress Notes (Signed)
 Patient's mother request referral to behavioral health facility as his current day program is requesting an updated diagnoses to have on file.  Referral placed to psychiatry.

## 2023-06-11 NOTE — Telephone Encounter (Signed)
 Pts mother is calling to check status.  Clovis Dar, I see that Dr. Rochelle Chu placed a referral but I can't see the status.  Can you check on this please? Macario Savin, CMA

## 2023-06-14 NOTE — Telephone Encounter (Signed)
 Called mother.   Referral information discussed and given.  She will call back with issues.

## 2023-09-30 ENCOUNTER — Ambulatory Visit: Payer: 59

## 2023-09-30 VITALS — Wt 207.0 lb

## 2023-09-30 DIAGNOSIS — Z0000 Encounter for general adult medical examination without abnormal findings: Secondary | ICD-10-CM

## 2023-09-30 NOTE — Patient Instructions (Addendum)
 Brian Livingston,  Thank you for taking the time for your Medicare Wellness Visit. I appreciate your continued commitment to your health goals. Please review the care plan we discussed, and feel free to reach out if I can assist you further.  Medicare recommends these wellness visits once per year to help you and your care team stay ahead of potential health issues. These visits are designed to focus on prevention, allowing your provider to concentrate on managing your acute and chronic conditions during your regular appointments.  Please note that Annual Wellness Visits do not include a physical exam. Some assessments may be limited, especially if the visit was conducted virtually. If needed, we may recommend a separate in-person follow-up with your provider.  Ongoing Care Seeing your primary care provider every 3 to 6 months helps us  monitor your health and provide consistent, personalized care.   Referrals If a referral was made during today's visit and you haven't received any updates within two weeks, please contact the referred provider directly to check on the status.  Recommended Screenings:  Health Maintenance  Topic Date Due   Hepatitis B Vaccine (1 of 3 - 19+ 3-dose series) Never done   Colon Cancer Screening  Never done   Flu Shot  08/02/2023   Pneumococcal Vaccine for age over 31 (1 of 1 - PCV) Never done   Zoster (Shingles) Vaccine (1 of 2) Never done   Medicare Annual Wellness Visit  09/29/2024   DTaP/Tdap/Td vaccine (3 - Td or Tdap) 11/02/2027   COVID-19 Vaccine  Completed   Hepatitis C Screening  Completed   HIV Screening  Completed   HPV Vaccine  Aged Out   Meningitis B Vaccine  Aged Out       09/30/2023    2:01 PM  Advanced Directives  Does Patient Have a Medical Advance Directive? No  Would patient like information on creating a medical advance directive? Yes (MAU/Ambulatory/Procedural Areas - Information given)   Advance Care Planning is important because  it: Ensures you receive medical care that aligns with your values, goals, and preferences. Provides guidance to your family and loved ones, reducing the emotional burden of decision-making during critical moments.  Information on Advanced Care Planning can be found at [place]  Secretary of [facility] Advance Health Care Directives Advance Health Care Directives ([email or web address]   Vision: Annual vision screenings are recommended for early detection of glaucoma, cataracts, and diabetic retinopathy. These exams can also reveal signs of chronic conditions such as diabetes and high blood pressure.  Dental: Annual dental screenings help detect early signs of oral cancer, gum disease, and other conditions linked to overall health, including heart disease and diabetes.  Please see the attached documents for additional preventive care recommendations.

## 2023-09-30 NOTE — Progress Notes (Signed)
 Subjective:   Brian Livingston is a 48 y.o. who presents for a Medicare Wellness preventive visit.  As a reminder, Annual Wellness Visits don't include a physical exam, and some assessments may be limited, especially if this visit is performed virtually. We may recommend an in-person follow-up visit with your provider if needed.  Visit Complete: Virtual I connected with  Brian Livingston on 09/30/23 by a audio enabled telemedicine application and verified that I am speaking with the correct person using two identifiers.  Patient Location: Home  Provider Location: Home Office  I discussed the limitations of evaluation and management by telemedicine. The patient expressed understanding and agreed to proceed.  Vital Signs: Because this visit was a virtual/telehealth visit, some criteria may be missing or patient reported. Any vitals not documented were not able to be obtained and vitals that have been documented are patient reported.  VideoDeclined- This patient declined Librarian, academic. Therefore the visit was completed with audio only.  Persons Participating in Visit: Patient assisted by mother- Ivette .  AWV Questionnaire: No: Patient Medicare AWV questionnaire was not completed prior to this visit.  Cardiac Risk Factors include: male gender     Objective:    Today's Vitals   09/30/23 1358  Weight: 207 lb (93.9 kg)   Body mass index is 31.47 kg/m.     09/30/2023    2:01 PM 09/28/2022    3:02 PM 11/14/2020    2:51 PM 11/05/2019    1:41 PM 10/23/2017    9:28 AM 09/23/2015    1:49 PM 09/17/2014   10:18 AM  Advanced Directives  Does Patient Have a Medical Advance Directive? No No No No No  Yes  Yes   Type of Physicist, medical of Healthcare Power of Attorney in Chart?      No - copy requested  No - copy requested   Would patient like information on creating a medical advance directive? Yes  (MAU/Ambulatory/Procedural Areas - Information given) Yes (MAU/Ambulatory/Procedural Areas - Information given) No - Patient declined No - Patient declined No - Patient declined        Data saved with a previous flowsheet row definition    Current Medications (verified) No outpatient encounter medications on file as of 09/30/2023.   No facility-administered encounter medications on file as of 09/30/2023.    Allergies (verified) Patient has no known allergies.   History: Past Medical History:  Diagnosis Date   Autism    History reviewed. No pertinent surgical history. Family History  Problem Relation Age of Onset   Diabetes Father    Kidney disease Father    Diabetes Sister    Diabetes Brother    Social History   Socioeconomic History   Marital status: Single    Spouse name: Not on file   Number of children: Not on file   Years of education: Not on file   Highest education level: Not on file  Occupational History   Not on file  Tobacco Use   Smoking status: Never   Smokeless tobacco: Never  Substance and Sexual Activity   Alcohol use: Never   Drug use: Never   Sexual activity: Never  Other Topics Concern   Not on file  Social History Narrative   Not on file   Social Drivers of Health   Financial Resource Strain: Low Risk  (09/30/2023)   Overall Physicist, medical Strain (  CARDIA)    Difficulty of Paying Living Expenses: Not hard at all  Food Insecurity: No Food Insecurity (09/30/2023)   Hunger Vital Sign    Worried About Running Out of Food in the Last Year: Never true    Ran Out of Food in the Last Year: Never true  Transportation Needs: No Transportation Needs (09/30/2023)   PRAPARE - Administrator, Civil Service (Medical): No    Lack of Transportation (Non-Medical): No  Physical Activity: Insufficiently Active (09/30/2023)   Exercise Vital Sign    Days of Exercise per Week: 3 days    Minutes of Exercise per Session: 30 min  Stress: No Stress  Concern Present (09/30/2023)   [employer] of Occupational Health - Occupational Stress Questionnaire    Feeling of Stress: Not at all  Social Connections: Socially Isolated (09/30/2023)   Social Connection and Isolation Panel    Frequency of Communication with Friends and Family: Never    Frequency of Social Gatherings with Friends and Family: Once a week    Attends Religious Services: More than 4 times per year    Active Member of [employer] or Organizations: No    Attends Banker Meetings: Never    Marital Status: Never married    Tobacco Counseling Counseling given: Not Answered    Clinical Intake:  Pre-visit preparation completed: Yes  Pain : No/denies pain  Diabetes: No  Lab Results  Component Value Date   HGBA1C 5.6 11/05/2019   HGBA1C 5.8 (H) 10/31/2018     How often do you need to have someone help you when you read instructions, pamphlets, or other written materials from your doctor or pharmacy?: 1 - Never  Interpreter Needed?: No  Information entered by :: Charmaine Bloodgood LPN   Activities of Daily Living     09/30/2023    1:59 PM  In your present state of health, do you have any difficulty performing the following activities:  Hearing? 0  Vision? 0  Difficulty concentrating or making decisions? 1  Walking or climbing stairs? 0  Dressing or bathing? 0  Doing errands, shopping? 1  Preparing Food and eating ? N  Using the Toilet? N  In the past six months, have you accidently leaked urine? N  Do you have problems with loss of bowel control? N  Managing your Medications? Y  Managing your Finances? Y  Housekeeping or managing your Housekeeping? Y    Patient Care Team: Bhagat, Virali, DO as PCP - General (Family Medicine)  I have updated your Care Teams any recent Medical Services you may have received from other providers in the past year.     Assessment:   This is a routine wellness examination for Brian Livingston.  Hearing/Vision  screen Hearing Screening - Comments:: Denies hearing difficulties   Vision Screening - Comments:: No vision problems; will schedule routine eye exam    Goals Addressed             This Visit's Progress    DIET - EAT MORE FRUITS AND VEGETABLES   On track      Depression Screen     09/30/2023    2:00 PM 09/28/2022    3:00 PM 11/14/2020    2:51 PM 11/05/2019    1:40 PM 10/23/2017    9:28 AM 09/23/2015    1:50 PM 09/17/2014   10:18 AM  PHQ 2/9 Scores  PHQ - 2 Score 0 0 0 0 0 0   PHQ- 9 Score  0 0     Exception Documentation       Medical reason    Fall Risk     09/30/2023    2:01 PM 09/28/2022    3:03 PM 11/05/2019    1:40 PM 10/23/2017    9:28 AM 09/17/2014   10:18 AM  Fall Risk   Falls in the past year? 0 0 0 No  No   Number falls in past yr: 0 0 0    Injury with Fall? 0 0 0    Risk for fall due to : No Fall Risks No Fall Risks     Follow up Falls prevention discussed;Education provided;Falls evaluation completed Falls prevention discussed;Education provided;Falls evaluation completed        Data saved with a previous flowsheet row definition    MEDICARE RISK AT HOME:  Medicare Risk at Home Any stairs in or around the home?: No If so, are there any without handrails?: No Home free of loose throw rugs in walkways, pet beds, electrical cords, etc?: Yes Adequate lighting in your home to reduce risk of falls?: Yes Life alert?: No Use of a cane, walker or w/c?: No Grab bars in the bathroom?: Yes Shower chair or bench in shower?: No Elevated toilet seat or a handicapped toilet?: No  TIMED UP AND GO:  Was the test performed?  No  Cognitive Function: Unable: Due to language barrier, hearing or vision limitations or other     09/28/2022    3:03 PM  MMSE - Mini Mental State Exam  Not completed: Unable to complete        Immunizations Immunization History  Administered Date(s) Administered   Influenza Whole 10/11/2008   Influenza, Seasonal, Injecte,  Preservative Fre 11/22/2022   Influenza,inj,Quad PF,6+ Mos 10/31/2012, 10/23/2013, 09/17/2014, 09/23/2015, 10/23/2017, 10/03/2018, 11/20/2021   Influenza-Unspecified 10/04/2019, 11/12/2020   PFIZER(Purple Top)SARS-COV-2 Vaccination 03/26/2019, 04/16/2019   Pfizer(Comirnaty)Fall Seasonal Vaccine 12 years and older 11/20/2021, 11/22/2022   Td 07/01/2005   Tdap 11/01/2017    Screening Tests Health Maintenance  Topic Date Due   Hepatitis B Vaccines 19-59 Average Risk (1 of 3 - 19+ 3-dose series) Never done   Colonoscopy  Never done   Influenza Vaccine  08/02/2023   Pneumococcal Vaccine: 50+ Years (1 of 1 - PCV) Never done   Zoster Vaccines- Shingrix (1 of 2) Never done   Medicare Annual Wellness (AWV)  09/29/2024   DTaP/Tdap/Td (3 - Td or Tdap) 11/02/2027   COVID-19 Vaccine  Completed   Hepatitis C Screening  Completed   HIV Screening  Completed   HPV VACCINES  Aged Out   Meningococcal B Vaccine  Aged Out    Health Maintenance Items Addressed: Vaccines Due: flu, pneumonia, shingrix   Additional Screening:  Vision Screening: Recommended annual ophthalmology exams for early detection of glaucoma and other disorders of the eye. Is the patient up to date with their annual eye exam?  No  Who is the provider or what is the name of the office in which the patient attends annual eye exams? none  Dental Screening: Recommended annual dental exams for proper oral hygiene  Community Resource Referral / Chronic Care Management: CRR required this visit?  No   CCM required this visit?  No   Plan:    I have personally reviewed and noted the following in the patient's chart:   Medical and social history Use of alcohol, tobacco or illicit drugs  Current medications and supplements including opioid prescriptions. Patient is not  currently taking opioid prescriptions. Functional ability and status Nutritional status Physical activity Advanced directives List of other  physicians Hospitalizations, surgeries, and ER visits in previous 12 months Vitals Screenings to include cognitive, depression, and falls Referrals and appointments  In addition, I have reviewed and discussed with patient certain preventive protocols, quality metrics, and best practice recommendations. A written personalized care plan for preventive services as well as general preventive health recommendations were provided to patient.   Lavelle Pfeiffer [place], [place]   [id number]   After Visit Summary: (MyChart) Due to this being a telephonic visit, the after visit summary with patients personalized plan was offered to patient via MyChart   Notes: Nothing significant to report at this time.

## 2023-11-21 ENCOUNTER — Ambulatory Visit

## 2023-11-21 VITALS — BP 113/73 | HR 74 | Ht 68.0 in | Wt 215.4 lb

## 2023-11-21 DIAGNOSIS — Z23 Encounter for immunization: Secondary | ICD-10-CM

## 2023-11-21 DIAGNOSIS — Z0000 Encounter for general adult medical examination without abnormal findings: Secondary | ICD-10-CM | POA: Diagnosis not present

## 2023-11-21 LAB — POCT GLYCOSYLATED HEMOGLOBIN (HGB A1C): Hemoglobin A1C: 5.4 % (ref 4.0–5.6)

## 2023-11-21 NOTE — Progress Notes (Unsigned)
=     SUBJECTIVE:   Chief compliant/HPI: annual examination  Brian Livingston is a 48 y.o. who presents today for an annual exam. He is accompanied by his mother who is providing history.  History tabs reviewed and updated  .   OBJECTIVE:   BP 113/73   Pulse 74   Ht 5' 8 (1.727 m)   Wt 215 lb 6.4 oz (97.7 kg)   SpO2 100%   BMI 32.75 kg/m    General: Alert, well-appearing male in NAD.  HEENT: No sign of trauma, EOM grossly intact. Neck: Supple, normal ROM Cardiovascular: RRR, no m/r/g appreciated. Pulmonary: Normal WOB. CTAB with no w/c/r present.  Abdomen: Soft, non-tender, non-distended. Extremities: Warm and well-perfused, without cyanosis or edema. Neurologic: Normal gait, moves all four extremities appropriately Skin: No rashes or lesions.  ASSESSMENT/PLAN:   Assessment & Plan  Brian Livingston is a very healthy 48 year old.  We will check a BMP today to monitor electrolytes as well as his HbA1c and lipid panel.  Patient is not on any prescribed medications and so does not need refills.  Patient can follow-up in 1 year or sooner if needed.  Patient's mom also requesting a letter to allow his day facility Brian Livingston to complete autism evaluation as required by their guidelines. Letter written and handed to patient's mom stating I believe this is appropriate.  Annual Examination  See AVS for age appropriate recommendations.  PHQ score - not completed as patient has autism and unable to complete  Blood pressure value is at goal, discussed.   Considered the following screening exams based upon USPSTF recommendations: Diabetes screening: ordered HIV testing:previously completed, non-reactive Hepatitis C: previously completed, normal Hepatitis B:not discussed Syphilis if at high risk: not indicated GC/CT not at high risk and not ordered. Lipid panel (nonfasting or fasting) discussed based upon AHA recommendations and ordered.  Consider repeat every 4-6 years.   Reviewed risk factors for latent tuberculosis and not indicated  Cancer Screening Discussion  Lung cancer screening:not indicated as does not meet criteria.  See documentation below regarding indications/risks/benefits.  Colorectal cancer screening: discussed options, elected to complete at next annual visit in 1 year.  PSA discussed and after engaging in discussion of possible risks, benefits and complications of screening. PSA: not indicated Vaccinations: PCV20 and flu.   Follow up in 1 year or sooner if indicated.  MyChart Activation: not discussed   Brian Jernigan, DO [facility]

## 2023-11-21 NOTE — Patient Instructions (Addendum)
 Thank you for visiting the clinic today, it was good to see you!  Our plans for today: - Cologuard at next yearly physical - Labs: BMP, Hgba1c, lipid panel. I will send you a MyChart message or a letter if results are normal. Otherwise, I will give you a call.  Please follow-up in 1 year. Make sure to bring ALL of your medications with you to every visit.   Please arrive 15 minutes PRIOR to your next scheduled appointment time! If you do not, this affects OTHER patients' care.  For any questions, please call the office at [phone] or send me a message in MyChart.  It was a pleasure to take care of you today. Have a great day!  Kamaryn Grimley, DO [facility] Resident, PGY-1  -------------------------------------------------------------------------------  Do you need your medications delivered to your home?   We'll send your prescription to the [facility] for delivery.          Address: [address] [place], [place], [place] [address]          Phone: [phone]  Please call the [facility] to speak with a pharmacist and set up your home medication delivery. If you have any questions, feel free to contact us  -- we're happy to help!  Other [facility] that offer affordable prices on both prescriptions and over-the-counter items, as well as convenient services like vaccinations, are  [facility], at [facility]         Address: [address], [place], [place] [address]         Phone: [phone]  [facility], located in the [facility]        Address: [address] [place], [place] [address]        Phone: [phone]  [facility], at [facility]       Address: [address], [place], [place] [address]       Phone: [phone]  [facility], at [facility]       Address: [address],  First Floor, [place], [place] [address]       Phone: [phone]  -------------------------------------------------------------------------------  The [facility] app, formally [employer], connects those living in [place], [place] & surrounding areas with healthy food options & access to emergency resources. This app aims to alleviate some of the barriers to food access by making the many ways people in [place], [place] & surrounding areas can access food resources publicly available.    This app is the product of the [employer], whose mission is to coordinate and improve the effectiveness of entities in [place] [employer] focused on alleviating hunger by creating and executing citywide and neighborhood-focused initiatives to develop more just and sustainable food systems.

## 2023-11-22 LAB — LIPID PANEL
Chol/HDL Ratio: 3 ratio (ref 0.0–5.0)
Cholesterol, Total: 151 mg/dL (ref 100–199)
HDL: 51 mg/dL (ref >39)
LDL Chol Calc (NIH): 89 mg/dL (ref 0–99)
Triglycerides: 51 mg/dL (ref 0–149)
VLDL Cholesterol Cal: 11 mg/dL (ref 5–40)

## 2023-11-22 LAB — BASIC METABOLIC PANEL WITH GFR
BUN/Creatinine Ratio: 13 (ref 9–20)
BUN: 12 mg/dL (ref 6–24)
CO2: 24 mmol/L (ref 20–29)
Calcium: 9.6 mg/dL (ref 8.7–10.2)
Chloride: 102 mmol/L (ref 96–106)
Creatinine, Ser: 0.92 mg/dL (ref 0.76–1.27)
Glucose: 94 mg/dL (ref 70–99)
Potassium: 4.3 mmol/L (ref 3.5–5.2)
Sodium: 140 mmol/L (ref 134–144)
eGFR: 101 mL/min/1.73 (ref >59)

## 2023-11-25 ENCOUNTER — Ambulatory Visit: Payer: Self-pay
